# Patient Record
Sex: Male | Born: 1985 | Race: Black or African American | Hispanic: No | Marital: Single | Smoking: Current every day smoker
Health system: Southern US, Community
[De-identification: ages and names within clinical notes are randomized; demographics above are authoritative.]

## PROBLEM LIST (undated history)

## (undated) DIAGNOSIS — K219 Gastro-esophageal reflux disease without esophagitis: Secondary | ICD-10-CM

## (undated) DIAGNOSIS — F32A Depression, unspecified: Secondary | ICD-10-CM

## (undated) HISTORY — DX: Depression, unspecified: F32.A

## (undated) HISTORY — DX: Gastro-esophageal reflux disease without esophagitis: K21.9

---
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
## 2010-11-25 ENCOUNTER — Emergency Department (HOSPITAL_COMMUNITY)
Admission: EM | Admit: 2010-11-25 | Discharge: 2010-11-25 | Payer: Self-pay | Source: Home / Self Care | Admitting: Emergency Medicine

## 2011-03-01 LAB — URINALYSIS, ROUTINE W REFLEX MICROSCOPIC
Bilirubin Urine: NEGATIVE
Glucose, UA: NEGATIVE mg/dL
Hgb urine dipstick: NEGATIVE
Ketones, ur: NEGATIVE mg/dL
Nitrite: NEGATIVE
Protein, ur: NEGATIVE mg/dL
Specific Gravity, Urine: 1.011 (ref 1.005–1.030)
Urobilinogen, UA: 1 mg/dL (ref 0.0–1.0)
pH: 7.5 (ref 5.0–8.0)

## 2011-03-01 LAB — GC/CHLAMYDIA PROBE AMP, GENITAL
Chlamydia, DNA Probe: NEGATIVE
GC Probe Amp, Genital: NEGATIVE

## 2011-07-26 ENCOUNTER — Emergency Department (HOSPITAL_COMMUNITY): Payer: Self-pay

## 2011-07-26 ENCOUNTER — Emergency Department (HOSPITAL_COMMUNITY)
Admission: EM | Admit: 2011-07-26 | Discharge: 2011-07-26 | Disposition: A | Discharge status: Home / Self Care | Payer: Self-pay | Attending: Emergency Medicine | Admitting: Emergency Medicine

## 2011-07-26 DIAGNOSIS — Y990 Civilian activity done for income or pay: Secondary | ICD-10-CM | POA: Insufficient documentation

## 2011-07-26 DIAGNOSIS — S81009A Unspecified open wound, unspecified knee, initial encounter: Secondary | ICD-10-CM | POA: Insufficient documentation

## 2011-07-26 DIAGNOSIS — Y9269 Other specified industrial and construction area as the place of occurrence of the external cause: Secondary | ICD-10-CM | POA: Insufficient documentation

## 2011-07-26 DIAGNOSIS — W268XXA Contact with other sharp object(s), not elsewhere classified, initial encounter: Secondary | ICD-10-CM | POA: Insufficient documentation

## 2016-08-23 ENCOUNTER — Emergency Department (HOSPITAL_COMMUNITY): Payer: Self-pay

## 2016-08-23 ENCOUNTER — Emergency Department (HOSPITAL_COMMUNITY)
Admission: EM | Admit: 2016-08-23 | Discharge: 2016-08-23 | Disposition: A | Discharge status: Home / Self Care | Payer: Self-pay | Attending: Emergency Medicine | Admitting: Emergency Medicine

## 2016-08-23 ENCOUNTER — Encounter (HOSPITAL_COMMUNITY): Payer: Self-pay | Admitting: Emergency Medicine

## 2016-08-23 DIAGNOSIS — K921 Melena: Secondary | ICD-10-CM | POA: Insufficient documentation

## 2016-08-23 DIAGNOSIS — R109 Unspecified abdominal pain: Secondary | ICD-10-CM

## 2016-08-23 DIAGNOSIS — Y999 Unspecified external cause status: Secondary | ICD-10-CM | POA: Insufficient documentation

## 2016-08-23 DIAGNOSIS — Y929 Unspecified place or not applicable: Secondary | ICD-10-CM | POA: Insufficient documentation

## 2016-08-23 DIAGNOSIS — Y939 Activity, unspecified: Secondary | ICD-10-CM | POA: Insufficient documentation

## 2016-08-23 DIAGNOSIS — F17200 Nicotine dependence, unspecified, uncomplicated: Secondary | ICD-10-CM | POA: Insufficient documentation

## 2016-08-23 LAB — COMPREHENSIVE METABOLIC PANEL
ALK PHOS: 53 U/L (ref 38–126)
ALT: 19 U/L (ref 17–63)
AST: 27 U/L (ref 15–41)
Albumin: 3.9 g/dL (ref 3.5–5.0)
Anion gap: 3 — ABNORMAL LOW (ref 5–15)
BUN: 9 mg/dL (ref 6–20)
CALCIUM: 8.9 mg/dL (ref 8.9–10.3)
CO2: 26 mmol/L (ref 22–32)
CREATININE: 1.01 mg/dL (ref 0.61–1.24)
Chloride: 106 mmol/L (ref 101–111)
GFR calc non Af Amer: >60 mL/min (ref >60)
Glucose, Bld: 113 mg/dL — ABNORMAL HIGH (ref 65–99)
Potassium: 4.1 mmol/L (ref 3.5–5.1)
SODIUM: 135 mmol/L (ref 135–145)
Total Bilirubin: 0.7 mg/dL (ref 0.3–1.2)
Total Protein: 6.9 g/dL (ref 6.5–8.1)

## 2016-08-23 LAB — URINE MICROSCOPIC-ADD ON
Squamous Epithelial / LPF: NONE SEEN
WBC, UA: NONE SEEN WBC/hpf (ref 0–5)

## 2016-08-23 LAB — CBC
HCT: 42.7 % (ref 39.0–52.0)
Hemoglobin: 14.3 g/dL (ref 13.0–17.0)
MCH: 31.8 pg (ref 26.0–34.0)
MCHC: 33.5 g/dL (ref 30.0–36.0)
MCV: 95.1 fL (ref 78.0–100.0)
PLATELETS: 189 10*3/uL (ref 150–400)
RBC: 4.49 MIL/uL (ref 4.22–5.81)
RDW: 12.5 % (ref 11.5–15.5)
WBC: 11.3 10*3/uL — ABNORMAL HIGH (ref 4.0–10.5)

## 2016-08-23 LAB — URINALYSIS, ROUTINE W REFLEX MICROSCOPIC
BILIRUBIN URINE: NEGATIVE
GLUCOSE, UA: NEGATIVE mg/dL
KETONES UR: NEGATIVE mg/dL
Leukocytes, UA: NEGATIVE
Nitrite: NEGATIVE
PROTEIN: NEGATIVE mg/dL
Specific Gravity, Urine: 1.026 (ref 1.005–1.030)
pH: 6 (ref 5.0–8.0)

## 2016-08-23 LAB — LIPASE, BLOOD: Lipase: 23 U/L (ref 11–51)

## 2016-08-23 LAB — POC OCCULT BLOOD, ED: FECAL OCCULT BLD: POSITIVE — AB

## 2016-08-23 MED ORDER — IOPAMIDOL (ISOVUE-300) INJECTION 61%
INTRAVENOUS | Status: AC
  Administered 2016-08-23: 100 mL
  Filled 2016-08-23: qty 100

## 2016-08-23 NOTE — ED Provider Notes (Signed)
[facility]-EMERGENCY DEPT Provider Note   CSN: [id number] Arrival date & time: 08/23/16  1021     History   Chief Complaint Chief Complaint  Patient presents with  . Abdominal Pain    HPI BURTIS IMHOFF is a 30 y.o. male.  Patient s/p fall from horse 3 days ago.  Pt states horse reared up, then turned to side, and lost balance, causing patient to fall off, and that part of horse hit his left side. C/o left mid to upper abd pain, constant, dull, moderate, without specific exacerbating or alleviating factors. Today saw small amt blood/blood streaks in stool. No melena. Normal appetite. No vomiting. No hematuria. No chest pain or sob. Denies head injury or loc. No headache. No neck or back pain. Denies any other pain or injury.    The history is provided by the patient.  Abdominal Pain   Pertinent negatives include fever, vomiting and headaches.    History reviewed. No pertinent past medical history.  There are no active problems to display for this patient.   No past surgical history on file.     Home Medications    Prior to Admission medications   Not on File    Family History No family history on file.  Social History Social History  Substance Use Topics  . Smoking status: Current Every Day Smoker  . Smokeless tobacco: Never Used  . Alcohol use Not on file     Allergies   Review of patient's allergies indicates no known allergies.   Review of Systems Review of Systems  Constitutional: Negative for fever.  HENT: Negative for sore throat.   Eyes: Negative for redness.  Respiratory: Negative for shortness of breath.   Cardiovascular: Negative for chest pain.  Gastrointestinal: Positive for abdominal pain. Negative for vomiting.  Genitourinary: Negative for flank pain.  Musculoskeletal: Negative for back pain and neck pain.  Skin: Negative for rash.  Neurological: Negative for headaches.  Hematological: Does not bruise/bleed easily.    Psychiatric/Behavioral: Negative for confusion.     Physical Exam Updated Vital Signs BP 116/70 (BP Location: Right Arm)   Pulse 74   Temp 98.7 F (37.1 C) (Oral)   Resp 20   Ht 6\' 2"  (1.88 m)   Wt 83.9 kg   SpO2 99%   BMI 23.75 kg/m   Physical Exam  Constitutional: He is oriented to person, place, and time. He appears well-developed and well-nourished. No distress.  HENT:  Head: Atraumatic.  Eyes: Pupils are equal, round, and reactive to light.  Neck: Neck supple. No tracheal deviation present.  Cardiovascular: Normal rate, regular rhythm, normal heart sounds and intact distal pulses.   No murmur heard. Pulmonary/Chest: Effort normal and breath sounds normal. No accessory muscle usage. No respiratory distress. He exhibits no tenderness.  Abdominal: Soft. Bowel sounds are normal. He exhibits no distension and no mass. There is tenderness. There is no rebound and no guarding. No hernia.  Left upper abd tenderness.   Genitourinary:  Genitourinary Comments: No cva tenderness. Brown stool, ?streak brb (chaperoned exam w nurse).   Musculoskeletal: Normal range of motion. He exhibits no edema or tenderness.  CTLS spine, non tender, aligned, no step off. No focal bony tenderness on extremity exam.   Neurological: He is alert and oriented to person, place, and time.  Steady gait.   Skin: Skin is warm and dry.  Psychiatric: He has a normal mood and affect.  Nursing note and vitals reviewed.    ED Treatments /  Results  Labs (all labs ordered are listed, but only abnormal results are displayed) Results for orders placed or performed during the hospital encounter of 08/23/16  Lipase, blood  Result Value Ref Range   Lipase 23 11 - 51 U/L  Comprehensive metabolic panel  Result Value Ref Range   Sodium 135 135 - 145 mmol/L   Potassium 4.1 3.5 - 5.1 mmol/L   Chloride 106 101 - 111 mmol/L   CO2 26 22 - 32 mmol/L   Glucose, Bld 113 (H) 65 - 99 mg/dL   BUN 9 6 - 20 mg/dL    Creatinine, Ser 4.09 0.61 - 1.24 mg/dL   Calcium 8.9 8.9 - 81.1 mg/dL   Total Protein 6.9 6.5 - 8.1 g/dL   Albumin 3.9 3.5 - 5.0 g/dL   AST 27 15 - 41 U/L   ALT 19 17 - 63 U/L   Alkaline Phosphatase 53 38 - 126 U/L   Total Bilirubin 0.7 0.3 - 1.2 mg/dL   GFR calc non Af Amer >60 >60 mL/min   GFR calc Af Amer >60 >60 mL/min   Anion gap 3 (L) 5 - 15  CBC  Result Value Ref Range   WBC 11.3 (H) 4.0 - 10.5 K/uL   RBC 4.49 4.22 - 5.81 MIL/uL   Hemoglobin 14.3 13.0 - 17.0 g/dL   HCT 91.4 78.2 - 95.6 %   MCV 95.1 78.0 - 100.0 fL   MCH 31.8 26.0 - 34.0 pg   MCHC 33.5 30.0 - 36.0 g/dL   RDW 21.3 08.6 - 57.8 %   Platelets 189 150 - 400 K/uL  Urinalysis, Routine w reflex microscopic  Result Value Ref Range   Color, Urine YELLOW YELLOW   APPearance CLEAR CLEAR   Specific Gravity, Urine 1.026 1.005 - 1.030   pH 6.0 5.0 - 8.0   Glucose, UA NEGATIVE NEGATIVE mg/dL   Hgb urine dipstick TRACE (A) NEGATIVE   Bilirubin Urine NEGATIVE NEGATIVE   Ketones, ur NEGATIVE NEGATIVE mg/dL   Protein, ur NEGATIVE NEGATIVE mg/dL   Nitrite NEGATIVE NEGATIVE   Leukocytes, UA NEGATIVE NEGATIVE  Urine microscopic-add on  Result Value Ref Range   Squamous Epithelial / LPF NONE SEEN NONE SEEN   WBC, UA NONE SEEN 0 - 5 WBC/hpf   RBC / HPF 0-5 0 - 5 RBC/hpf   Bacteria, UA RARE (A) NONE SEEN   Urine-Other MUCOUS PRESENT   POC occult blood, ED  Result Value Ref Range   Fecal Occult Bld POSITIVE (A) NEGATIVE   Ct Abdomen Pelvis W Contrast  Result Date: 08/23/2016 CLINICAL DATA:  Horse fell on patient 4 days ago. Left flank pain with bloody stool starting yesterday. EXAM: CT ABDOMEN AND PELVIS WITH CONTRAST TECHNIQUE: Multidetector CT imaging of the abdomen and pelvis was performed using the standard protocol following bolus administration of intravenous contrast. CONTRAST:  ISOVUE-300 IOPAMIDOL (ISOVUE-300) INJECTION 61% COMPARISON:  None. FINDINGS: Lower chest:  Unremarkable. Hepatobiliary: 6 mm  well-defined hypo attenuating lesion inferior right liver is too small to characterize but likely represents a tiny cyst. Small area of low attenuation in the anterior liver, adjacent to the falciform ligament, is in a characteristic location for focal fatty deposition. No enhancing abnormality identified within the liver parenchyma. There is no evidence for gallstones, gallbladder wall thickening, or pericholecystic fluid. No intrahepatic or extrahepatic biliary dilation. Pancreas: No focal mass lesion. No dilatation of the main duct. No intraparenchymal cyst. No peripancreatic edema. Spleen: No splenomegaly. No focal mass  lesion. Adrenals/Urinary Tract: No adrenal nodule or mass. 8 mm hypo attenuating lesion upper pole left kidney is associated with 7 mm hypo attenuating well-defined lesion interpolar right kidney. Both lesions too small to characterize but likely represent tiny renal cysts. No evidence for hydroureter. The urinary bladder appears normal for the degree of distention. Stomach/Bowel: Stomach is decompressed. Slight prominence of duodenum. Small bowel loops are largely collapse throughout. The terminal ileum is normal. The appendix is not clearly visualized, but is likely in the anterior right lower quadrant. Although not a definite finding, there may be some mild circumferential wall thickening in the right colon. Vascular/Lymphatic: No abdominal aortic aneurysm. No abdominal aortic atherosclerotic calcification. Portal vein and superior mesenteric vein are patent. Splenic vein is patent. There is no gastrohepatic or hepatoduodenal ligament lymphadenopathy. No intraperitoneal or retroperitoneal lymphadenopathy. No pelvic sidewall lymphadenopathy. Reproductive: The prostate gland and seminal vesicles have normal imaging features. Other: Small volume intraperitoneal free fluid identified in the pelvis. Musculoskeletal: Bone windows reveal no worrisome lytic or sclerotic osseous lesions. Bone island  visualized in the right femoral head. IMPRESSION: 1. Small volume intraperitoneal free fluid, abnormal in a male patient although etiology not evident by CT today. 2. Question mild circumferential wall thickening right colon although not a definite finding. Small bowel and colon are largely decompressed throughout. 3. No evidence for acute traumatic injury in the liver or spleen. Electronically Signed   By: Kennith Center M.D.   On: 08/23/2016 12:57    EKG  EKG Interpretation None       Radiology Ct Abdomen Pelvis W Contrast  Result Date: 08/23/2016 CLINICAL DATA:  Horse fell on patient 4 days ago. Left flank pain with bloody stool starting yesterday. EXAM: CT ABDOMEN AND PELVIS WITH CONTRAST TECHNIQUE: Multidetector CT imaging of the abdomen and pelvis was performed using the standard protocol following bolus administration of intravenous contrast. CONTRAST:  ISOVUE-300 IOPAMIDOL (ISOVUE-300) INJECTION 61% COMPARISON:  None. FINDINGS: Lower chest:  Unremarkable. Hepatobiliary: 6 mm well-defined hypo attenuating lesion inferior right liver is too small to characterize but likely represents a tiny cyst. Small area of low attenuation in the anterior liver, adjacent to the falciform ligament, is in a characteristic location for focal fatty deposition. No enhancing abnormality identified within the liver parenchyma. There is no evidence for gallstones, gallbladder wall thickening, or pericholecystic fluid. No intrahepatic or extrahepatic biliary dilation. Pancreas: No focal mass lesion. No dilatation of the main duct. No intraparenchymal cyst. No peripancreatic edema. Spleen: No splenomegaly. No focal mass lesion. Adrenals/Urinary Tract: No adrenal nodule or mass. 8 mm hypo attenuating lesion upper pole left kidney is associated with 7 mm hypo attenuating well-defined lesion interpolar right kidney. Both lesions too small to characterize but likely represent tiny renal cysts. No evidence for  hydroureter. The urinary bladder appears normal for the degree of distention. Stomach/Bowel: Stomach is decompressed. Slight prominence of duodenum. Small bowel loops are largely collapse throughout. The terminal ileum is normal. The appendix is not clearly visualized, but is likely in the anterior right lower quadrant. Although not a definite finding, there may be some mild circumferential wall thickening in the right colon. Vascular/Lymphatic: No abdominal aortic aneurysm. No abdominal aortic atherosclerotic calcification. Portal vein and superior mesenteric vein are patent. Splenic vein is patent. There is no gastrohepatic or hepatoduodenal ligament lymphadenopathy. No intraperitoneal or retroperitoneal lymphadenopathy. No pelvic sidewall lymphadenopathy. Reproductive: The prostate gland and seminal vesicles have normal imaging features. Other: Small volume intraperitoneal free fluid identified in the pelvis.  Musculoskeletal: Bone windows reveal no worrisome lytic or sclerotic osseous lesions. Bone island visualized in the right femoral head. IMPRESSION: 1. Small volume intraperitoneal free fluid, abnormal in a male patient although etiology not evident by CT today. 2. Question mild circumferential wall thickening right colon although not a definite finding. Small bowel and colon are largely decompressed throughout. 3. No evidence for acute traumatic injury in the liver or spleen. Electronically Signed   By: Eric  MansellKennith Center M.D.   On: 08/23/2016 12:57    Procedures Procedures (including critical care time)  Medications Ordered in ED Medications - No data to display   Initial Impression / Assessment and Plan / ED Course  I have reviewed the triage vital signs and the nursing notes.  Pertinent labs & imaging results that were available during my care of the patient were reviewed by me and considered in my medical decision making (see chart for details).  Clinical Course    Iv ns. Labs. Ct.  On CT,  no acute, traumatic intraabdominal injury seen.  Incidental findings noted, and will relayed to pt for f/u.   Patients stools is medium brown, w small streak brb, and does test hemoccult pos - will have f/u as outpatient.   Pt afeb, no emesis, repeat abd exam reassuring.    Patient currently appears stable for d/c.     Final Clinical Impressions(s) / ED Diagnoses   Final diagnoses:  None    New Prescriptions New Prescriptions   No medications on file     Cathren LaineKevin Lauraann Missey, MD 08/23/16 1334

## 2016-08-23 NOTE — ED Triage Notes (Addendum)
Horse fell on him  Left side  And threw him on sat at 6 pm ab pain since Sunday am but states had a lot of etoh on sat also felt constipated now has blood in stool

## 2016-08-23 NOTE — Discharge Instructions (Signed)
It was our pleasure to provide your ER care today - we hope that you feel better.  Your CT scan was read as follows: IMPRESSION: 1. Small volume intraperitoneal free fluid, abnormal in a male patient although etiology not evident by CT today. 2. Question mild circumferential wall thickening right colon although not a definite finding. Small bowel and colon are largely decompressed throughout. 3. No evidence for acute traumatic injury in the liver or spleen. Hepatobiliary: 6 mm well-defined hypo attenuating lesion inferior right liver is too small to characterize but likely represents a tiny cyst.  For incidental CT findings above, follow up with primary care doctor in the next couple weeks - discuss results with them.   For recent blood in stool, as well as for follow up of above CT scan, follow up with GI specialist in the next couple weeks - see  referral - call office to arrange appointment.  Return to ER if worse, new symptoms, high fevers, persistent vomiting, worsening/severe abdominal pain, heavy bleeding, other concern.

## 2016-08-27 ENCOUNTER — Emergency Department (HOSPITAL_COMMUNITY): Payer: Self-pay

## 2016-08-27 ENCOUNTER — Observation Stay (HOSPITAL_COMMUNITY)
Admission: EM | Admit: 2016-08-27 | Discharge: 2016-08-28 | Disposition: A | Discharge status: Home / Self Care | Payer: Self-pay | Attending: Surgery | Admitting: Surgery

## 2016-08-27 ENCOUNTER — Encounter (HOSPITAL_COMMUNITY): Payer: Self-pay

## 2016-08-27 DIAGNOSIS — R109 Unspecified abdominal pain: Secondary | ICD-10-CM

## 2016-08-27 DIAGNOSIS — S3991XA Unspecified injury of abdomen, initial encounter: Principal | ICD-10-CM | POA: Diagnosis present

## 2016-08-27 DIAGNOSIS — F17210 Nicotine dependence, cigarettes, uncomplicated: Secondary | ICD-10-CM | POA: Insufficient documentation

## 2016-08-27 DIAGNOSIS — T1490XA Injury, unspecified, initial encounter: Secondary | ICD-10-CM

## 2016-08-27 DIAGNOSIS — K921 Melena: Secondary | ICD-10-CM | POA: Insufficient documentation

## 2016-08-27 LAB — URINALYSIS, ROUTINE W REFLEX MICROSCOPIC
BILIRUBIN URINE: NEGATIVE
GLUCOSE, UA: NEGATIVE mg/dL
Hgb urine dipstick: NEGATIVE
KETONES UR: NEGATIVE mg/dL
LEUKOCYTES UA: NEGATIVE
NITRITE: NEGATIVE
PH: 6 (ref 5.0–8.0)
PROTEIN: NEGATIVE mg/dL
Specific Gravity, Urine: 1.013 (ref 1.005–1.030)

## 2016-08-27 LAB — COMPREHENSIVE METABOLIC PANEL
ALBUMIN: 4 g/dL (ref 3.5–5.0)
ALK PHOS: 53 U/L (ref 38–126)
ALT: 16 U/L — AB (ref 17–63)
AST: 17 U/L (ref 15–41)
Anion gap: 8 (ref 5–15)
BILIRUBIN TOTAL: 0.8 mg/dL (ref 0.3–1.2)
BUN: 10 mg/dL (ref 6–20)
CO2: 25 mmol/L (ref 22–32)
CREATININE: 1.03 mg/dL (ref 0.61–1.24)
Calcium: 9 mg/dL (ref 8.9–10.3)
Chloride: 102 mmol/L (ref 101–111)
GFR calc Af Amer: >60 mL/min (ref >60)
GLUCOSE: 113 mg/dL — AB (ref 65–99)
POTASSIUM: 4 mmol/L (ref 3.5–5.1)
Sodium: 135 mmol/L (ref 135–145)
TOTAL PROTEIN: 7.7 g/dL (ref 6.5–8.1)

## 2016-08-27 LAB — CBC
HEMATOCRIT: 41.3 % (ref 39.0–52.0)
Hemoglobin: 14.4 g/dL (ref 13.0–17.0)
MCH: 32.1 pg (ref 26.0–34.0)
MCHC: 34.9 g/dL (ref 30.0–36.0)
MCV: 92.2 fL (ref 78.0–100.0)
PLATELETS: 202 10*3/uL (ref 150–400)
RBC: 4.48 MIL/uL (ref 4.22–5.81)
RDW: 12.3 % (ref 11.5–15.5)
WBC: 11.8 10*3/uL — AB (ref 4.0–10.5)

## 2016-08-27 LAB — LIPASE, BLOOD: Lipase: 21 U/L (ref 11–51)

## 2016-08-27 MED ORDER — IOPAMIDOL (ISOVUE-300) INJECTION 61%
100.0000 mL | Freq: Once | INTRAVENOUS | Status: AC | PRN
  Administered 2016-08-27: 100 mL via INTRAVENOUS

## 2016-08-27 MED ORDER — ONDANSETRON HCL 4 MG PO TABS: 4.0000 mg | ORAL_TABLET | Freq: Four times a day (QID) | ORAL | Status: DC | PRN

## 2016-08-27 MED ORDER — ACETAMINOPHEN 325 MG PO TABS
650.0000 mg | ORAL_TABLET | Freq: Once | ORAL | Status: AC
  Administered 2016-08-27: 650 mg via ORAL
  Filled 2016-08-27: qty 2

## 2016-08-27 MED ORDER — MORPHINE SULFATE (PF) 2 MG/ML IV SOLN
1.0000 mg | INTRAVENOUS | Status: DC | PRN
Start: 2016-08-27 — End: 2016-08-28
  Administered 2016-08-27: 1 mg via INTRAVENOUS
  Filled 2016-08-27: qty 1

## 2016-08-27 MED ORDER — ONDANSETRON HCL 4 MG/2ML IJ SOLN: 4.0000 mg | Freq: Four times a day (QID) | INTRAMUSCULAR | Status: DC | PRN

## 2016-08-27 MED ORDER — FAMOTIDINE IN NACL 20-0.9 MG/50ML-% IV SOLN
20.0000 mg | Freq: Once | INTRAVENOUS | Status: AC
  Administered 2016-08-27: 20 mg via INTRAVENOUS
  Filled 2016-08-27: qty 50

## 2016-08-27 MED ORDER — GI COCKTAIL ~~LOC~~
30.0000 mL | Freq: Once | ORAL | Status: AC
  Administered 2016-08-27: 30 mL via ORAL
  Filled 2016-08-27: qty 30

## 2016-08-27 MED ORDER — MORPHINE SULFATE (PF) 2 MG/ML IV SOLN: 1.0000 mg | INTRAVENOUS | Status: DC | PRN

## 2016-08-27 MED ORDER — HEPARIN SODIUM (PORCINE) 5000 UNIT/ML IJ SOLN
5000.0000 [IU] | Freq: Three times a day (TID) | INTRAMUSCULAR | Status: DC
  Administered 2016-08-27: 5000 [IU] via SUBCUTANEOUS
  Filled 2016-08-27: qty 1

## 2016-08-27 MED ORDER — KCL IN DEXTROSE-NACL 20-5-0.45 MEQ/L-%-% IV SOLN
INTRAVENOUS | Status: DC
  Administered 2016-08-27 – 2016-08-28 (×2): via INTRAVENOUS
  Filled 2016-08-27 (×2): qty 1000

## 2016-08-27 MED ORDER — HEPARIN SODIUM (PORCINE) 5000 UNIT/ML IJ SOLN: 5000.0000 [IU] | Freq: Three times a day (TID) | INTRAMUSCULAR | Status: DC

## 2016-08-27 MED ORDER — SODIUM CHLORIDE 0.9 % IV BOLUS (SEPSIS)
1000.0000 mL | Freq: Once | INTRAVENOUS | Status: AC
  Administered 2016-08-27: 1000 mL via INTRAVENOUS

## 2016-08-27 MED ORDER — KCL IN DEXTROSE-NACL 20-5-0.45 MEQ/L-%-% IV SOLN: INTRAVENOUS | Status: DC

## 2016-08-27 NOTE — ED Notes (Signed)
Pt has requested to transfer via private vehicle, Dr. Daphine DeutscherMartin has approved his request.

## 2016-08-27 NOTE — ED Provider Notes (Signed)
[facility]-EMERGENCY DEPT Provider Note   CSN: [id number] Arrival date & time: 08/27/16  1251     History   Chief Complaint Chief Complaint  Patient presents with  . Abdominal Pain    HPI Blake Harding is a 30 y.o. male.  HPI Blake Harding is a 30 y.o. male with no significant PMH who presents with continued abdominal pain.  Patient was seen 4 days ago for the same.  He fell off a horse 7 days ago and then began experiencing left sided abdominal pain.  He was seen in ED 4 days ago and had a normal abdominal CT, he had fecal occult positive and given outpatient GI follow up.  He presents today with continued upper and left sided abdominal pain he describes as burning.  Worse with eating.  Associated symptoms include intermittent N/V and diarrhea.  Had 6 episodes this morning he describes as "medium brown with some blood".  He states he feels more gassy than normal.  No fever or urinary symptoms.  He has not taken anything for pain.  Has been able to tolerate ginger ale and crackers.  No abdominal surgeries.  Smokes 0.5 ppd. Drinks "socially".   History reviewed. No pertinent past medical history.  Patient Active Problem List   Diagnosis Date Noted  . Blunt abdominal trauma 08/27/2016    History reviewed. No pertinent surgical history.     Home Medications    Prior to Admission medications   Not on File    Family History History reviewed. No pertinent family history.  Social History Social History  Substance Use Topics  . Smoking status: Current Every Day Smoker  . Smokeless tobacco: Never Used  . Alcohol use Yes     Comment: social     Allergies   Review of patient's allergies indicates no known allergies.   Review of Systems Review of Systems All other systems negative unless otherwise stated in HPI   Physical Exam Updated Vital Signs BP 100/65 (BP Location: Left Arm)   Pulse (!) 56   Temp 98.3 F (36.8 C)   Resp 14   Ht 6\' 2"  (1.88 m)   Wt 81.6  kg   SpO2 100%   BMI 23.11 kg/m   Physical Exam  Constitutional: He is oriented to person, place, and time. He appears well-developed and well-nourished.  Non-toxic appearance. He does not have a sickly appearance. He does not appear ill.  HENT:  Head: Normocephalic and atraumatic.  Mouth/Throat: Oropharynx is clear and moist.  Eyes: Conjunctivae are normal. Pupils are equal, round, and reactive to light.  Neck: Normal range of motion. Neck supple.  Cardiovascular: Normal rate, regular rhythm and normal heart sounds.   Pulmonary/Chest: Effort normal and breath sounds normal. No accessory muscle usage or stridor. No respiratory distress. He has no wheezes. He has no rhonchi. He has no rales.  Abdominal: Soft. Bowel sounds are normal. He exhibits no distension. There is tenderness in the epigastric area, left upper quadrant and left lower quadrant. There is no rebound and no guarding.  Musculoskeletal: Normal range of motion.  No t/l/s midline tenderness.   Lymphadenopathy:    He has no cervical adenopathy.  Neurological: He is alert and oriented to person, place, and time.  Speech clear without dysarthria.  Skin: Skin is warm and dry.  No bruising or signs of trauma.   Psychiatric: He has a normal mood and affect. His behavior is normal.     ED Treatments / Results  Labs (all labs ordered are listed, but only abnormal results are displayed) Labs Reviewed  COMPREHENSIVE METABOLIC PANEL - Abnormal; Notable for the following:       Result Value   Glucose, Bld 113 (*)    ALT 16 (*)    All other components within normal limits  CBC - Abnormal; Notable for the following:    WBC 11.8 (*)    All other components within normal limits  LIPASE, BLOOD  URINALYSIS, ROUTINE W REFLEX MICROSCOPIC (NOT [facility])    EKG  EKG Interpretation None       Radiology Ct Abdomen Pelvis W Contrast  Result Date: 08/27/2016 CLINICAL DATA:  Status post trauma. A horse fell on the patient last  Saturday with left-sided abdominal pain. Bloody stool. A CT scan from August 23, 2016 demonstrated fluid in the pelvis with no underlying cause identified. EXAM: CT ABDOMEN AND PELVIS WITH CONTRAST TECHNIQUE: Multidetector CT imaging of the abdomen and pelvis was performed using the standard protocol following bolus administration of intravenous contrast. CONTRAST:  100mL ISOVUE-300 IOPAMIDOL (ISOVUE-300) INJECTION 61% COMPARISON:  August 23, 2016 FINDINGS: Lower chest: Normal. Hepatobiliary: A tiny low-attenuation lesion in the left hepatic lobe on image 16 is too small characterize but probably a cyst. A 6 mm lesion in the right hepatic lobe on image 36 is unchanged and almost certainly a cyst as well but too small to characterize. The liver, portal vein, and gallbladder are normal. Pancreas: Unremarkable. No pancreatic ductal dilatation or surrounding inflammatory changes. Spleen: Normal in size without focal abnormality. Adrenals/Urinary Tract: Probable tiny renal cysts are identified but too small to characterize. Stomach/Bowel: The stomach and small bowel are unremarkable. The appendix is not well visualized but there is no evidence of appendicitis. The colon is not well assessed due to lack of distention with oral contrast but no colonic abnormalities are seen. Vascular/Lymphatic: No significant vascular findings are present. No enlarged abdominal or pelvic lymph nodes. Reproductive: Prostate is unremarkable. Other: No free air is identified on today's study. The free fluid in the pelvis on the previous study has significantly improved with only minimal fluid remaining. Musculoskeletal: No fractures. IMPRESSION: 1. The amount of free fluid in the pelvis has significantly decreased in the interval with only a small amount of fluid remaining. 2. No cause for bloody stool is seen. Evaluation of colon is limited with CT imaging in general but especially due to the lack of distention with oral contrast. 3. No  other acute abnormalities. Electronically Signed   By: Gerome Samavid  Williams III M.D   On: 08/27/2016 17:02    Procedures Procedures (including critical care time)  Medications Ordered in ED Medications  sodium chloride 0.9 % bolus 1,000 mL (0 mLs Intravenous Stopped 08/27/16 1600)  famotidine (PEPCID) IVPB 20 mg premix (0 mg Intravenous Stopped 08/27/16 1600)  acetaminophen (TYLENOL) tablet 650 mg (650 mg Oral Given 08/27/16 1342)  gi cocktail (Maalox,Lidocaine,Donnatal) (30 mLs Oral Given 08/27/16 1342)  iopamidol (ISOVUE-300) 61 % injection 100 mL (100 mLs Intravenous Contrast Given 08/27/16 1638)     Initial Impression / Assessment and Plan / ED Course  I have reviewed the triage vital signs and the nursing notes.  Pertinent labs & imaging results that were available during my care of the patient were reviewed by me and considered in my medical decision making (see chart for details).  Clinical Course   Patient presents with persistent burning epigastric and left sided abdominal pain.  Seen 4 days ago with normal  CT.  Fecal occult positive, given GI outpatient follow up.  Patient appears well, non-toxic or septic.  VSS, NAD.  Mild epigastric, LUQ, and LLQ abdominal tenderness.  No rebound, guarding, or rigidity.  No signs of trauma.  Will repeat labs, IVF, gi cocktail, pepcid, and tylenol and reassess.  2:30 PM: Stable hgb. Patient reports some improvement.  Case discussed with Dr. Dalene Seltzer.  Patient with hx of trauma, persistent abdominal pain, tender on exam, and previous CT findings of intraperitoneal free fluid 4 days ago.  Will obtain repeat CT.    This shows improved free fluid in the pelvis with small amount remaining.  Patient seen by Dr. Dalene Seltzer, patient with continued abdominal pain.  General surgery, Dr. Daphine Deutscher consulted and evaluated the patient.  Will admit and transfer to trauma service at [facility].    Final Clinical Impressions(s) / ED  Diagnoses   Final diagnoses:  Abdominal pain, unspecified abdominal location    New Prescriptions New Prescriptions   No medications on file     Cheri Fowler, PA-C 08/27/16 1952    Cheri Fowler, PA-C 08/27/16 1953    Alvira Monday, MD 08/30/16 2150

## 2016-08-27 NOTE — ED Notes (Signed)
Writer made pt aware that urine is needed for a sample, urinal at bedside

## 2016-08-27 NOTE — ED Triage Notes (Signed)
Pt here with abdominal pain on Tuesday.  Pt CT states free fluid. Pt states pain is staying same. Pt also indicates injury on Saturday to abdomen.  Pain started on Monday.  N/V.  Unknown fever.  Bloody stools

## 2016-08-27 NOTE — H&P (Signed)
Chief Complaint:  Horse rolled over him last Saturday evening  History of Present Illness:  Blake Harding is an 31 y.o. male who had a horse rear and fall back on him last Saturday night.  Was on a trail ride and at the time he did not experience pain.  The next morning he had severe abdominal soreness like bruised muscle.  Began having pain with eating and then some vomiting.  Was seen in the ER at [facility] on Tuesday and after CT scan which showed some free abdominal fluid and a WBC of 11 k, he was discharged.  Didn't feel better and on Thursday evening he had chills and fever.  Last night he passed what looks like fresh bloody mucous per rectum.    He presented to the [facility] ER and I was called and am transferring him to [facility] for observation for possible compression serosal injury to the colon.  Will get lipase to rule out duodenal or pancreatic injury.    History reviewed. No pertinent past medical history.  History reviewed. No pertinent surgical history.  No current facility-administered medications for this encounter.    No current outpatient prescriptions on file.   Review of patient's allergies indicates no known allergies. History reviewed. No pertinent family history. Social History:   reports that he has been smoking.  He has never used smokeless tobacco. He reports that he drinks alcohol. He reports that he does not use drugs.   REVIEW OF SYSTEMS : Negative except for healthy  Physical Exam:   Blood pressure 111/59, pulse 62, temperature 98.4 F (36.9 C), temperature source Oral, resp. rate 18, height _0  (1.88 m), weight 81.6 kg (180 lb), SpO2 100 %. Body mass index is 23.11 kg/m.  Gen:  WDWN AAM NAD  Neurological: Alert and oriented to person, place, and time. Motor and sensory function is grossly intact  Head: Normocephalic and atraumatic.  Eyes: Conjunctivae are normal. Pupils are equal, round, and reactive to light. No scleral icterus.  Neck: Normal range of motion.  Neck supple. No tracheal deviation or thyromegaly present.  Cardiovascular:  SR without murmurs or gallops.  No carotid bruits Breast:  Not examined Respiratory: Effort normal.  No respiratory distress. No chest wall tenderness. Breath sounds normal.  No wheezes, rales or rhonchi.  Abdomen:  Muscular and tender especially in the left lower quadrant GU:  Normal male Musculoskeletal: Normal range of motion. Extremities are nontender. No cyanosis, edema or clubbing noted Lymphadenopathy: No cervical, preauricular, postauricular or axillary adenopathy is present Skin: Skin is warm and dry. No rash noted. No diaphoresis. No erythema. No pallor. Pscyh: Normal mood and affect. Behavior is normal. Judgment and thought content normal.   LABORATORY RESULTS: Results for orders placed or performed during the hospital encounter of 08/27/16 (from the past 48 hour(s))  Lipase, blood     Status: None   Collection Time: 08/27/16  1:13 PM  Result Value Ref Range   Lipase 21 11 - 51 U/L  Comprehensive metabolic panel     Status: Abnormal   Collection Time: 08/27/16  1:13 PM  Result Value Ref Range   Sodium 135 135 - 145 mmol/L   Potassium 4.0 3.5 - 5.1 mmol/L   Chloride 102 101 - 111 mmol/L   CO2 25 22 - 32 mmol/L   Glucose, Bld 113 (H) 65 - 99 mg/dL   BUN 10 6 - 20 mg/dL   Creatinine, Ser 1.03 0.61 - 1.24 mg/dL   Calcium 9.0  8.9 - 10.3 mg/dL   Total Protein 7.7 6.5 - 8.1 g/dL   Albumin 4.0 3.5 - 5.0 g/dL   AST 17 15 - 41 U/L   ALT 16 (L) 17 - 63 U/L   Alkaline Phosphatase 53 38 - 126 U/L   Total Bilirubin 0.8 0.3 - 1.2 mg/dL   GFR calc non Af Amer >60 >60 mL/min   GFR calc Af Amer >60 >60 mL/min    Comment: (NOTE) The eGFR has been calculated using the CKD EPI equation. This calculation has not been validated in all clinical situations. eGFR's persistently <60 mL/min signify possible Chronic Kidney Disease.    Anion gap 8 5 - 15  CBC     Status: Abnormal   Collection Time: 08/27/16  1:13 PM   Result Value Ref Range   WBC 11.8 (H) 4.0 - 10.5 K/uL   RBC 4.48 4.22 - 5.81 MIL/uL   Hemoglobin 14.4 13.0 - 17.0 g/dL   HCT 41.3 39.0 - 52.0 %   MCV 92.2 78.0 - 100.0 fL   MCH 32.1 26.0 - 34.0 pg   MCHC 34.9 30.0 - 36.0 g/dL   RDW 12.3 11.5 - 15.5 %   Platelets 202 150 - 400 K/uL  Urinalysis, Routine w reflex microscopic     Status: None   Collection Time: 08/27/16  4:42 PM  Result Value Ref Range   Color, Urine YELLOW YELLOW   APPearance CLEAR CLEAR   Specific Gravity, Urine 1.013 1.005 - 1.030   pH 6.0 5.0 - 8.0   Glucose, UA NEGATIVE NEGATIVE mg/dL   Hgb urine dipstick NEGATIVE NEGATIVE   Bilirubin Urine NEGATIVE NEGATIVE   Ketones, ur NEGATIVE NEGATIVE mg/dL   Protein, ur NEGATIVE NEGATIVE mg/dL   Nitrite NEGATIVE NEGATIVE   Leukocytes, UA NEGATIVE NEGATIVE    Comment: MICROSCOPIC NOT DONE ON URINES WITH NEGATIVE PROTEIN, BLOOD, LEUKOCYTES, NITRITE, OR GLUCOSE <1000 mg/dL.     RADIOLOGY RESULTS: Ct Abdomen Pelvis W Contrast  Result Date: 08/27/2016 CLINICAL DATA:  Status post trauma. A horse fell on the patient last Saturday with left-sided abdominal pain. Bloody stool. A CT scan from August 23, 2016 demonstrated fluid in the pelvis with no underlying cause identified. EXAM: CT ABDOMEN AND PELVIS WITH CONTRAST TECHNIQUE: Multidetector CT imaging of the abdomen and pelvis was performed using the standard protocol following bolus administration of intravenous contrast. CONTRAST:  126m ISOVUE-300 IOPAMIDOL (ISOVUE-300) INJECTION 61% COMPARISON:  August 23, 2016 FINDINGS: Lower chest: Normal. Hepatobiliary: A tiny low-attenuation lesion in the left hepatic lobe on image 16 is too small characterize but probably a cyst. A 6 mm lesion in the right hepatic lobe on image 36 is unchanged and almost certainly a cyst as well but too small to characterize. The liver, portal vein, and gallbladder are normal. Pancreas: Unremarkable. No pancreatic ductal dilatation or surrounding  inflammatory changes. Spleen: Normal in size without focal abnormality. Adrenals/Urinary Tract: Probable tiny renal cysts are identified but too small to characterize. Stomach/Bowel: The stomach and small bowel are unremarkable. The appendix is not well visualized but there is no evidence of appendicitis. The colon is not well assessed due to lack of distention with oral contrast but no colonic abnormalities are seen. Vascular/Lymphatic: No significant vascular findings are present. No enlarged abdominal or pelvic lymph nodes. Reproductive: Prostate is unremarkable. Other: No free air is identified on today's study. The free fluid in the pelvis on the previous study has significantly improved with only minimal fluid remaining. Musculoskeletal: No  fractures. IMPRESSION: 1. The amount of free fluid in the pelvis has significantly decreased in the interval with only a small amount of fluid remaining. 2. No cause for bloody stool is seen. Evaluation of colon is limited with CT imaging in general but especially due to the lack of distention with oral contrast. 3. No other acute abnormalities. Electronically Signed   By: Dorise Bullion III M.D   On: 08/27/2016 17:02    Problem List: There are no active problems to display for this patient.   Assessment & Plan: WBC 11K with free abdominal fluid and history of animal rollover trauma.  Transfer to trauma service for observation and possilbe empiric antibiotic tx.      Matt B. Hassell Done, MD, [facility] [phone] beeper [phone]  08/27/2016 7:31 PM

## 2016-08-28 DIAGNOSIS — T1490XA Injury, unspecified, initial encounter: Secondary | ICD-10-CM

## 2016-08-28 LAB — CBC WITH DIFFERENTIAL/PLATELET
BASOS ABS: 0.2 10*3/uL — AB (ref 0.0–0.1)
Basophils Relative: 2 %
EOS ABS: 0.2 10*3/uL (ref 0.0–0.7)
Eosinophils Relative: 2 %
HEMATOCRIT: 38.8 % — AB (ref 39.0–52.0)
HEMOGLOBIN: 12.8 g/dL — AB (ref 13.0–17.0)
LYMPHS ABS: 2.3 10*3/uL (ref 0.7–4.0)
LYMPHS PCT: 30 %
MCH: 31.1 pg (ref 26.0–34.0)
MCHC: 33 g/dL (ref 30.0–36.0)
MCV: 94.2 fL (ref 78.0–100.0)
MONOS PCT: 12 %
Monocytes Absolute: 0.9 10*3/uL (ref 0.1–1.0)
NEUTROS ABS: 4 10*3/uL (ref 1.7–7.7)
Neutrophils Relative %: 54 %
Platelets: 191 10*3/uL (ref 150–400)
RBC: 4.12 MIL/uL — AB (ref 4.22–5.81)
RDW: 12.3 % (ref 11.5–15.5)
WBC: 7.6 10*3/uL (ref 4.0–10.5)

## 2016-08-28 LAB — COMPREHENSIVE METABOLIC PANEL
ALT: 15 U/L — ABNORMAL LOW (ref 17–63)
ANION GAP: 8 (ref 5–15)
AST: 15 U/L (ref 15–41)
Albumin: 3 g/dL — ABNORMAL LOW (ref 3.5–5.0)
Alkaline Phosphatase: 46 U/L (ref 38–126)
BUN: 6 mg/dL (ref 6–20)
CHLORIDE: 101 mmol/L (ref 101–111)
CO2: 26 mmol/L (ref 22–32)
Calcium: 8.6 mg/dL — ABNORMAL LOW (ref 8.9–10.3)
Creatinine, Ser: 1.14 mg/dL (ref 0.61–1.24)
Glucose, Bld: 97 mg/dL (ref 65–99)
Potassium: 3.6 mmol/L (ref 3.5–5.1)
SODIUM: 135 mmol/L (ref 135–145)
Total Bilirubin: 0.5 mg/dL (ref 0.3–1.2)
Total Protein: 6 g/dL — ABNORMAL LOW (ref 6.5–8.1)

## 2016-08-28 LAB — CBC
HCT: 37.6 % — ABNORMAL LOW (ref 39.0–52.0)
HEMOGLOBIN: 12.8 g/dL — AB (ref 13.0–17.0)
MCH: 31.7 pg (ref 26.0–34.0)
MCHC: 34 g/dL (ref 30.0–36.0)
MCV: 93.1 fL (ref 78.0–100.0)
PLATELETS: 176 10*3/uL (ref 150–400)
RBC: 4.04 MIL/uL — AB (ref 4.22–5.81)
RDW: 12.2 % (ref 11.5–15.5)
WBC: 10.3 10*3/uL (ref 4.0–10.5)

## 2016-08-28 LAB — CREATININE, SERUM: CREATININE: 1.1 mg/dL (ref 0.61–1.24)

## 2016-08-28 LAB — AMYLASE
AMYLASE: 46 U/L (ref 28–100)
Amylase: 45 U/L (ref 28–100)

## 2016-08-28 LAB — LIPASE, BLOOD: LIPASE: 19 U/L (ref 11–51)

## 2016-08-28 MED ORDER — WHITE PETROLATUM GEL
Status: AC
  Administered 2016-08-28: 09:00:00
  Filled 2016-08-28: qty 1

## 2016-08-28 NOTE — Progress Notes (Signed)
Nurse paged me stating that pt did well with diet today. No n/v/abd pain. Just a small blood streak on TP. Vitals stable.   Per Dr Derrell Lollingamirez, pt could be discharged if tolerated diet without pain/vital sign issues  Pt given dc instructions  Mary Sellaric M. Andrey CampanileWilson, MD, FACS General, Bariatric, & Minimally Invasive Surgery [facility] [place]

## 2016-08-28 NOTE — Progress Notes (Signed)
Subjective: Doing well No abd pain since admission Some bloody mucus stools  Objective: Vital signs in last 24 hours: Temp:  [98.2 F (36.8 C)-99.2 F (37.3 C)] 98.2 F (36.8 C) (09/10 0538) Pulse Rate:  [56-82] 58 (09/10 0538) Resp:  [14-18] 17 (09/10 0538) BP: (99-112)/(57-82) 105/58 (09/10 0538) SpO2:  [98 %-100 %] 98 % (09/10 0538) Weight:  [81.6 kg (180 lb)-83.5 kg (184 lb)] 83.5 kg (184 lb) (09/09 2249) Last BM Date: 08/27/16  Intake/Output from previous day: 09/09 0701 - 09/10 0700 In: 1945 [P.O.:220; I.V.:675; IV Piggyback:1050] Out: -  Intake/Output this shift: No intake/output data recorded.  General appearance: alert and cooperative Abd: min abdominal pain LLQ/suprapubic area  Lab Results:   Recent Labs  08/27/16 2349 08/28/16 0541  WBC 10.3 7.6  HGB 12.8* 12.8*  HCT 37.6* 38.8*  PLT 176 191   BMET  Recent Labs  08/27/16 1313 08/27/16 2349 08/28/16 0541  NA 135  --  135  K 4.0  --  3.6  CL 102  --  101  CO2 25  --  26  GLUCOSE 113*  --  97  BUN 10  --  6  CREATININE 1.03 1.10 1.14  CALCIUM 9.0  --  8.6*    Studies/Results: Ct Abdomen Pelvis W Contrast  Result Date: 08/27/2016 CLINICAL DATA:  Status post trauma. A horse fell on the patient last Saturday with left-sided abdominal pain. Bloody stool. A CT scan from August 23, 2016 demonstrated fluid in the pelvis with no underlying cause identified. EXAM: CT ABDOMEN AND PELVIS WITH CONTRAST TECHNIQUE: Multidetector CT imaging of the abdomen and pelvis was performed using the standard protocol following bolus administration of intravenous contrast. CONTRAST:  100mL ISOVUE-300 IOPAMIDOL (ISOVUE-300) INJECTION 61% COMPARISON:  August 23, 2016 FINDINGS: Lower chest: Normal. Hepatobiliary: A tiny low-attenuation lesion in the left hepatic lobe on image 16 is too small characterize but probably a cyst. A 6 mm lesion in the right hepatic lobe on image 36 is unchanged and almost certainly a cyst as  well but too small to characterize. The liver, portal vein, and gallbladder are normal. Pancreas: Unremarkable. No pancreatic ductal dilatation or surrounding inflammatory changes. Spleen: Normal in size without focal abnormality. Adrenals/Urinary Tract: Probable tiny renal cysts are identified but too small to characterize. Stomach/Bowel: The stomach and small bowel are unremarkable. The appendix is not well visualized but there is no evidence of appendicitis. The colon is not well assessed due to lack of distention with oral contrast but no colonic abnormalities are seen. Vascular/Lymphatic: No significant vascular findings are present. No enlarged abdominal or pelvic lymph nodes. Reproductive: Prostate is unremarkable. Other: No free air is identified on today's study. The free fluid in the pelvis on the previous study has significantly improved with only minimal fluid remaining. Musculoskeletal: No fractures. IMPRESSION: 1. The amount of free fluid in the pelvis has significantly decreased in the interval with only a small amount of fluid remaining. 2. No cause for bloody stool is seen. Evaluation of colon is limited with CT imaging in general but especially due to the lack of distention with oral contrast. 3. No other acute abnormalities. Electronically Signed   By: Gerome Samavid  Williams III M.D   On: 08/27/2016 17:02    Assessment/Plan: 30 y/o M s/p fall from horse with blunt abd trauma -Pt's pain has improved since admission.  He has not had any PO since the last few days.  Will attempt PO trial and if tolerated OK for  home.  If he has abd pain may require another day or two for serial abd exam and possible CT with V contrast.  Blood in stools improving  LOS: 1 day    Marigene Ehlers., Jed Limerick 08/28/2016

## 2016-08-28 NOTE — Discharge Instructions (Signed)
Blunt Abdominal Trauma °Blunt abdominal trauma is a type of injury that involves damage to the abdominal wall or to abdominal organs, such as the liver or spleen. The damage can involve bruising, tearing, or a rupture. This type of injury does not involve a puncture of the skin. °Blunt abdominal trauma can range from mild to severe. In some cases it can lead to a severe abdominal inflammation (peritonitis), severe bleeding, and a dangerous drop in blood pressure. °CAUSES °This injury is caused by a hard, direct hit to the abdomen. It can happen after: °· A motor vehicle accident. °· Being kicked or punched in the abdomen. °· Falling from a significant height. °RISK FACTORS °This injury is more likely to happen in people who: °· Play contact sports. °· Work in a job in which falls or injuries are more likely, such as in construction. °SYMPTOMS °The main symptom of this condition is pain in the abdomen. Other symptoms depend on the type and location of the injury. They can include: °· Abdominal pain that spreads to the the back or shoulder. °· Bruising. °· Swelling. °· Pain when pressing on the abdomen. °· Blood in the urine. °· Weakness. °· Confusion. °· Loss of consciousness. °· Pale, dusky, cool, or sweaty skin. °· Vomiting blood. °· Bloody stool or bleeding from the rectum. °· Trouble breathing. °Symptoms of this injury can develop suddenly or slowly.  °DIAGNOSIS °This injury is diagnosed based on your symptoms and a physical exam. You may also have tests, including: °· Blood tests. °· Urine tests. °· Imaging tests, such as: °¨ A CT scan and ultrasound of your abdomen. °¨ X-rays of your chest and abdomen. °· A test in which a tube is used to flush your abdomen with fluid and check for blood (diagnostic peritoneal lavage). °TREATMENT °Treatment for this injury depends on its type and severity. Treatment options include: °· Observation. If the injury is mild, this may be the only treatment needed. °· Support of your  blood pressure and breathing. °· Getting blood, fluids, or medicine through an IV tube. °· Antibiotic medicine. °· Insertion of tubes into the stomach or bladder. °· A blood transfusion. °· A procedure to stop bleeding. This involves putting a long, thin tube (catheter) into one of your blood vessels (angiographic embolization). °· Surgery to open up your abdomen and control bleeding or repair damage (laparotomy). This may be done if tests suggest that you have peritonitis or bleeding that cannot be controlled with angiographic embolization. °HOME CARE INSTRUCTIONS °· Take medicines only as directed by your health care provider. °· If you were prescribed an antibiotic medicine, finish all of it even if you start to feel better. °· Follow your health care provider's instructions about diet and activity restrictions. °· Keep all follow-up visits as directed by your health care provider. This is important. °SEEK MEDICAL CARE IF: °· You continue to have abdominal pain. °· Your symptoms return. °· You develop new symptoms. °· You have blood in your urine or your bowel movements. °SEEK IMMEDIATE MEDICAL CARE IF: °· You vomit blood. °· You have heavy bleeding from your rectum. °· You have very bad abdominal pain. °· You have trouble breathing. °· You have chest pain. °· You have a fever. °· You have dizziness. °· You pass out. °  °This information is not intended to replace advice given to you by your health care provider. Make sure you discuss any questions you have with your health care provider. °  °Document Released:   01/12/2005 Document Revised: 04/21/2015 Document Reviewed: 11/26/2014 °Elsevier Interactive Patient Education ©2016 [employer] Inc[employer] ° °

## 2016-08-28 NOTE — Progress Notes (Signed)
1800 Pt is anxious to go home, tolerating soft diet, denies abdominal pain. Dr Andrey CampanileWilson notified, discharge order received. Discharged instructions given to pt, verbalized understanding.

## 2016-09-01 NOTE — Discharge Summary (Signed)
Physician Discharge Summary  Patient ID: Blake Harding MRN: [id number] DOB/AGE: 635-Oct-1987 30 y.o.  Admit date: 08/27/2016 Discharge date: 09/01/2016  Admission Diagnoses:status post fall from horse  Discharge Diagnoses:  Active Problems:   Blunt abdominal trauma   Blunt trauma   Discharged Condition: good  Hospital Course: patient was admitted from once a long ER to the floor secondary to a fall from horse approximately a week prior to admission.  Patient was having blood per rectum.  Patient on CT scan which revealed no acute trauma.  Patient had H&H drawn which trended he seems stable.  Blood per rectum decreased during his hospital stay.  His abdominal pain resolved while in the hospital.  He is able to tolerate regular diet.  He is otherwise detail for discharge and discharged home.  Consults: None  Significant Diagnostic Studies: none  Treatments: IV hydration and serial abdominal exams  Discharge Exam: Blood pressure 111/65, pulse (!) 55, temperature 98 F (36.7 C), temperature source Oral, resp. rate 17, height 6\' 2"  (1.88 m), weight 83.5 kg (184 lb), SpO2 100 %. General appearance: alert and cooperative GI: soft, non-tender; bowel sounds normal; no masses,  no organomegaly  Disposition: 01-Home or Self Care  Discharge Instructions    Call MD for:    Complete by:  As directed    Temperature >101   Call MD for:  hives    Complete by:  As directed    Call MD for:  persistant dizziness or light-headedness    Complete by:  As directed    Call MD for:  persistant nausea and vomiting    Complete by:  As directed    Call MD for:  redness, tenderness, or signs of infection (pain, swelling, redness, odor or green/yellow discharge around incision site)    Complete by:  As directed    Call MD for:  severe uncontrolled pain    Complete by:  As directed    Diet general    Complete by:  As directed    Continue light diet next few days   Discharge instructions    Complete  by:  As directed    See [facility] discharge instructions   Increase activity slowly    Complete by:  As directed        Medication List    You have not been prescribed any medications.    Follow-up Information    [facility]. Schedule an appointment as soon as possible for a visit in 2 week(s).   Contact information: [address] [place] [id number] [phone]          Signed: Marigene EhlersRamirez Jr., Jed Limerickrmando 09/01/2016, 8:36 AM

## 2017-08-11 IMAGING — CT CT ABD-PELV W/ CM
2 of 4 series · 16 of 46 positions shown, 18 images · IV contrast (ISOVUE)
Comparison: August 23, 2016

CLINICAL DATA: Status post trauma. A horse fell on the patient last
[REDACTED] with left-sided abdominal pain. Bloody stool. A CT scan
from August 23, 2016 demonstrated fluid in the pelvis with no
underlying cause identified.

EXAM:
CT ABDOMEN AND PELVIS WITH CONTRAST
TECHNIQUE: Multidetector CT imaging of the abdomen and pelvis was performed
using the standard protocol following bolus administration of
intravenous contrast.
CONTRAST:  100mL 0NYYBK-LOO IOPAMIDOL (0NYYBK-LOO) INJECTION 61%

[Series 2: abd/pel with · axial · 0.73mm/px · z∈[-483,-73]mm · 13 of 92 slices shown, 15 images]
[im 5/92  soft-tissue]
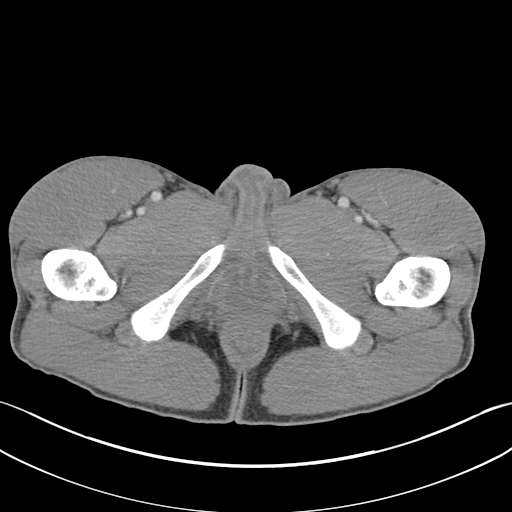
[im 5/92  bone]
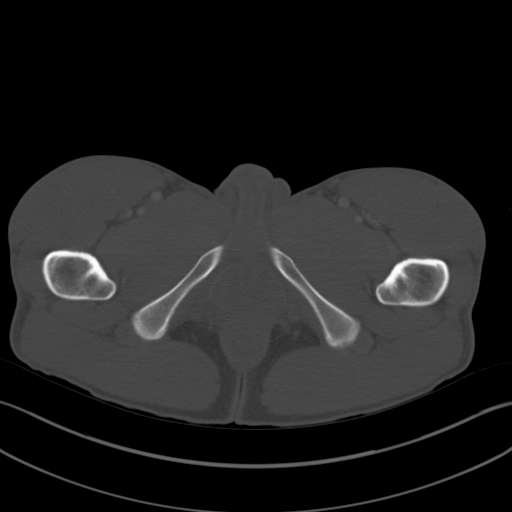
[im 14/92  soft-tissue]
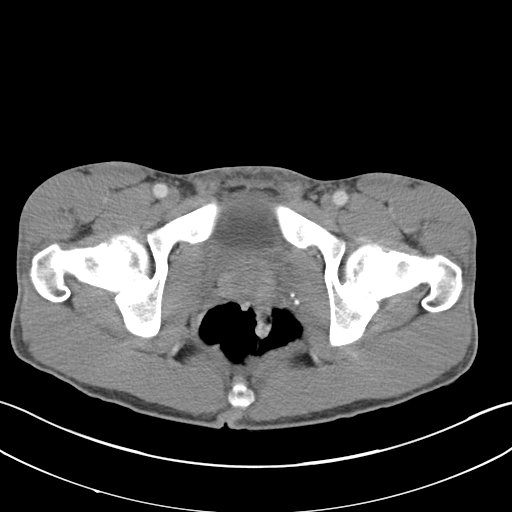
[im 19/92  soft-tissue]
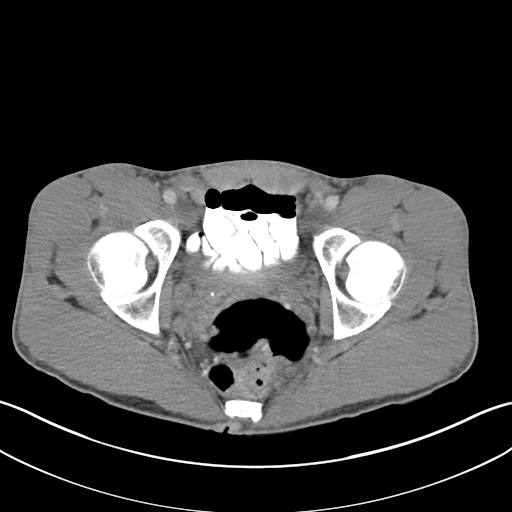
[im 28/92  soft-tissue]
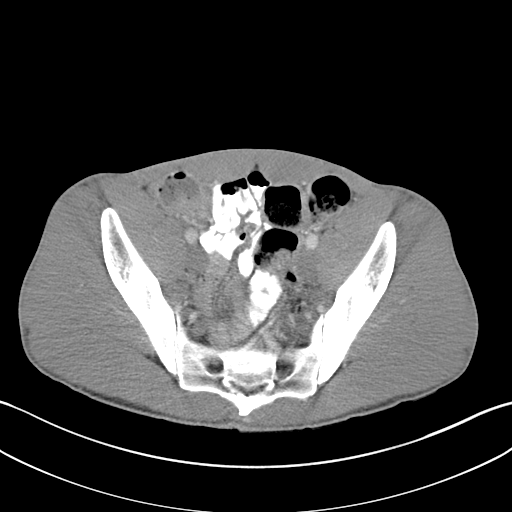
[im 32/92  soft-tissue]
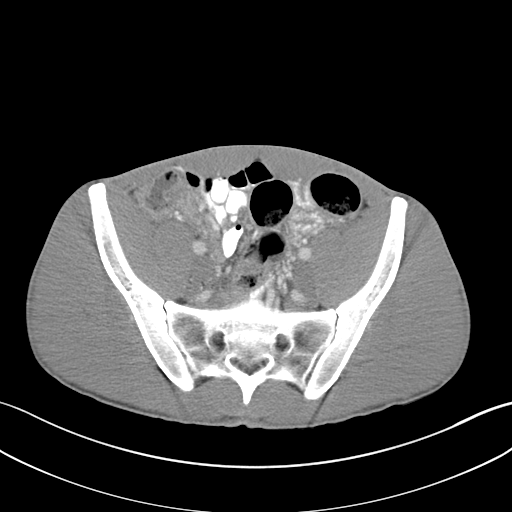
[im 41/92  soft-tissue]
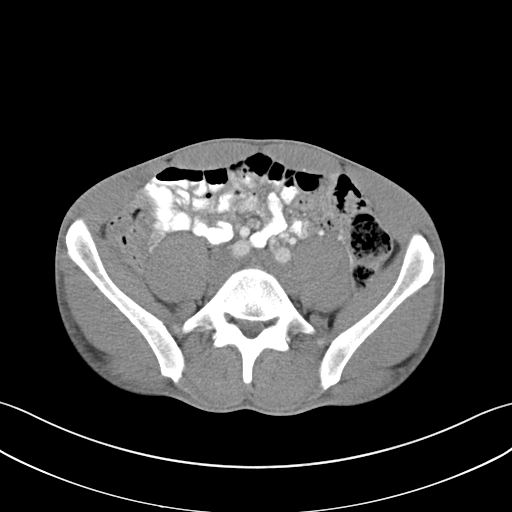
[im 46/92  soft-tissue]
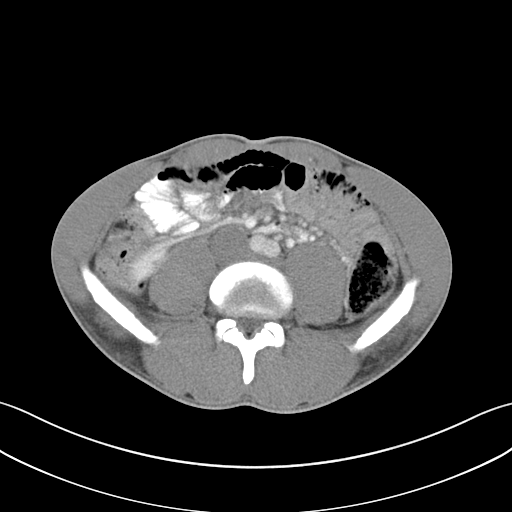
[im 51/92  soft-tissue]
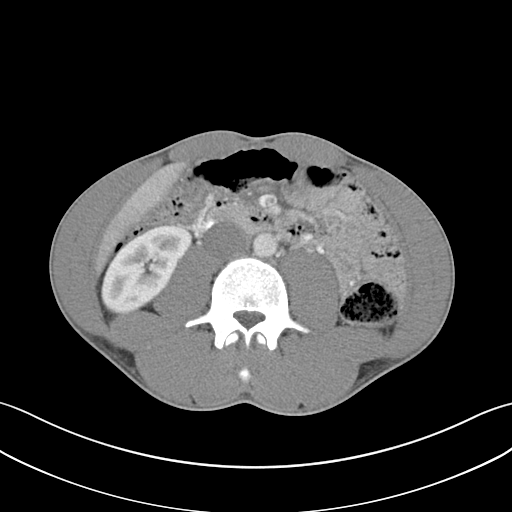
[im 60/92  soft-tissue]
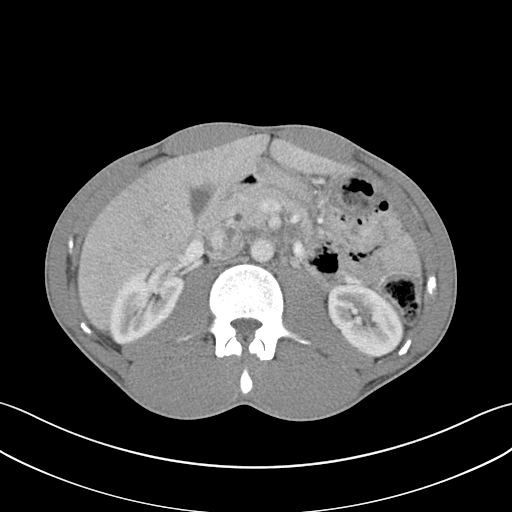
[im 60/92  bone]
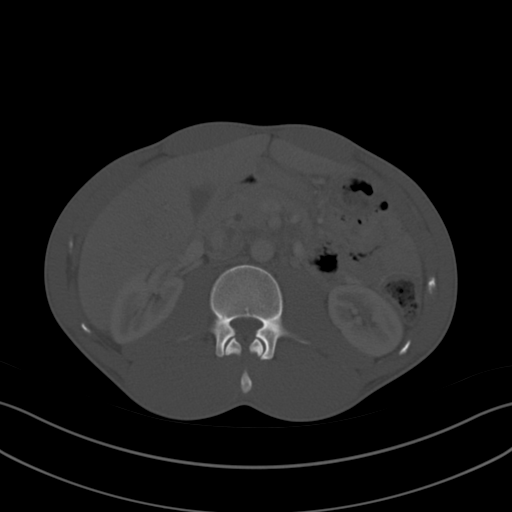
[im 64/92  soft-tissue]
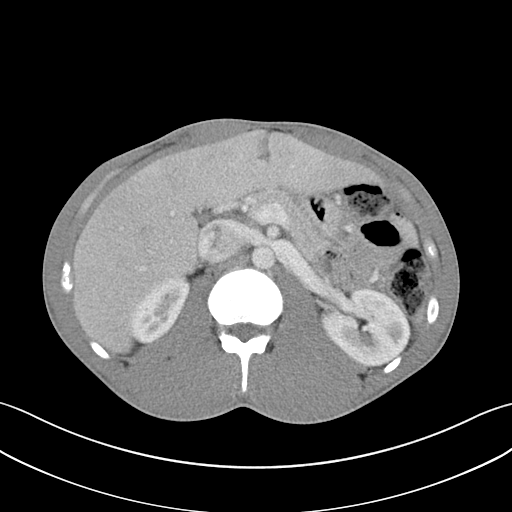
[im 73/92  soft-tissue]
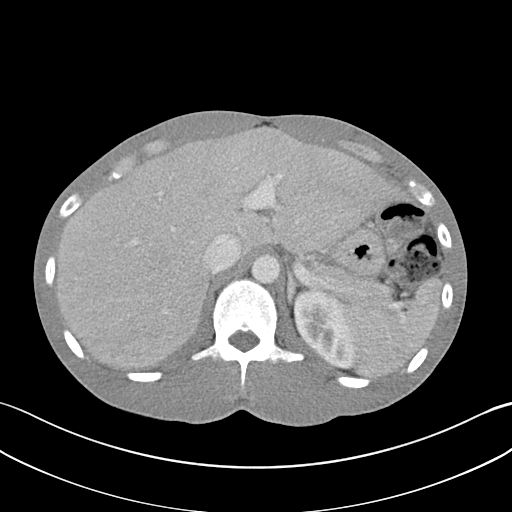
[im 78/92  soft-tissue]
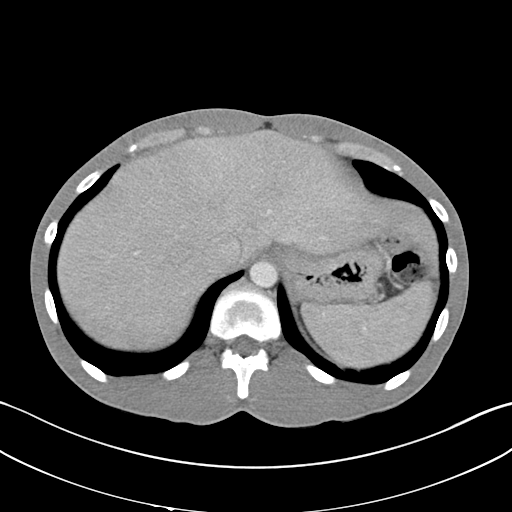
[im 87/92  soft-tissue]
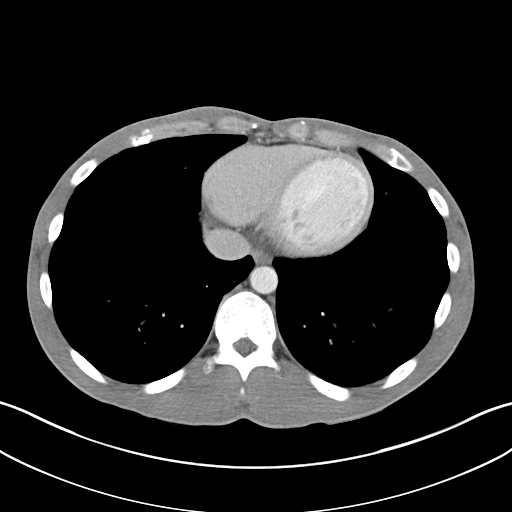

[Series 5: coronal a/|p · coronal · 0.74mm/px · 3 of 132 slices shown]
[im 44/132  soft-tissue]
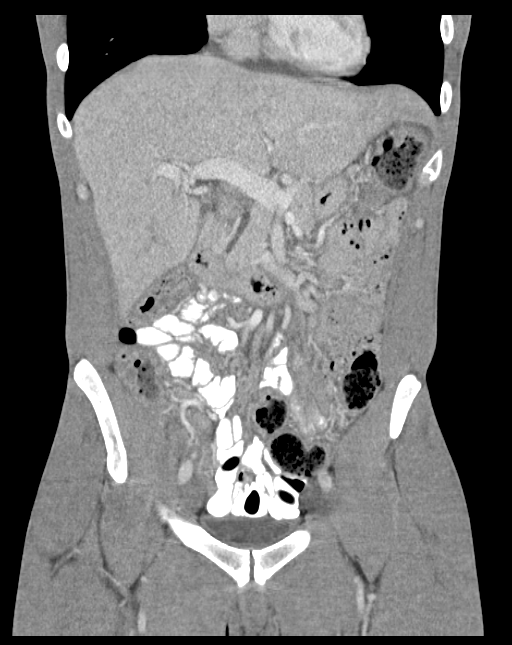
[im 59/132  soft-tissue]
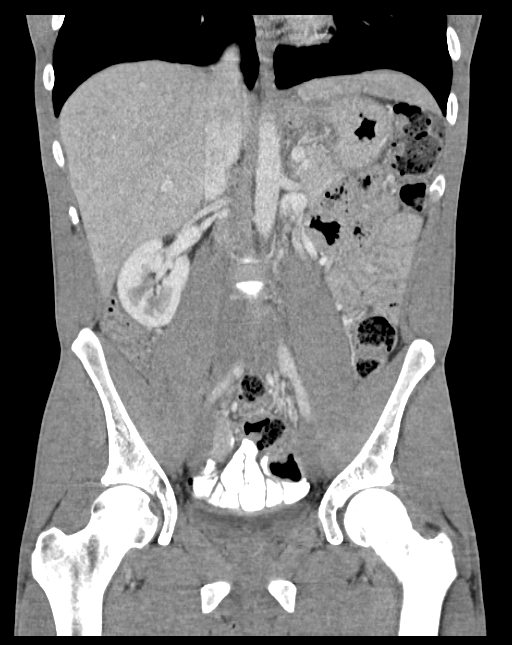
[im 73/132  soft-tissue]
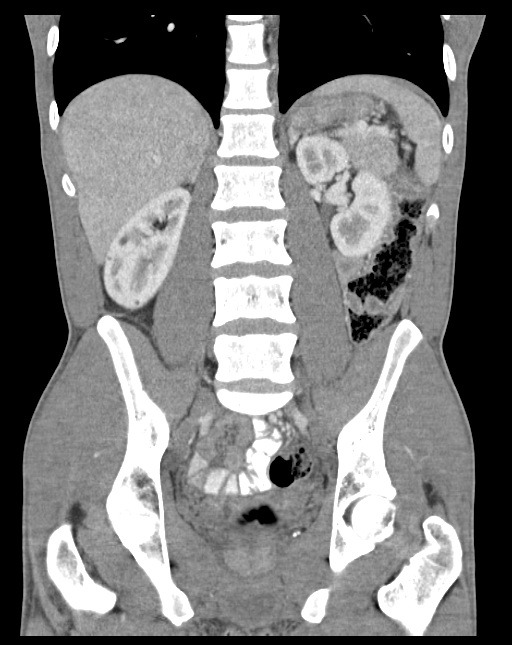

[16 of 46 positions shown; findings below may reference images not displayed]

FINDINGS: Lower chest: Normal.

Hepatobiliary: A tiny low-attenuation lesion in the left hepatic
lobe on image 16 is too small characterize but probably a cyst. A 6
mm lesion in the right hepatic lobe on image 36 is unchanged and
almost certainly a cyst as well but too small to characterize. The
liver, portal vein, and gallbladder are normal.

Pancreas: Unremarkable. No pancreatic ductal dilatation or
surrounding inflammatory changes.

Spleen: Normal in size without focal abnormality.

Adrenals/Urinary Tract: Probable tiny renal cysts are identified but
too small to characterize.

Stomach/Bowel: The stomach and small bowel are unremarkable. The
appendix is not well visualized but there is no evidence of
appendicitis. The colon is not well assessed due to lack of
distention with oral contrast but no colonic abnormalities are seen.

Vascular/Lymphatic: No significant vascular findings are present. No
enlarged abdominal or pelvic lymph nodes.

Reproductive: Prostate is unremarkable.

Other: No free air is identified on today's study. The free fluid in
the pelvis on the previous study has significantly improved with
only minimal fluid remaining.

Musculoskeletal: No fractures.
IMPRESSION: 1. The amount of free fluid in the pelvis has significantly
decreased in the interval with only a small amount of fluid
remaining.
2. No cause for bloody stool is seen. Evaluation of colon is limited
with CT imaging in general but especially due to the lack of
distention with oral contrast.
3. No other acute abnormalities.

## 2020-12-11 ENCOUNTER — Emergency Department (HOSPITAL_COMMUNITY): Payer: 59

## 2020-12-11 ENCOUNTER — Encounter (HOSPITAL_COMMUNITY): Payer: Self-pay | Admitting: Emergency Medicine

## 2020-12-11 ENCOUNTER — Emergency Department (HOSPITAL_COMMUNITY)
Admission: EM | Admit: 2020-12-11 | Discharge: 2020-12-11 | Disposition: A | Discharge status: Home / Self Care | Payer: 59 | Attending: Emergency Medicine | Admitting: Emergency Medicine

## 2020-12-11 DIAGNOSIS — F17200 Nicotine dependence, unspecified, uncomplicated: Secondary | ICD-10-CM | POA: Insufficient documentation

## 2020-12-11 DIAGNOSIS — M25512 Pain in left shoulder: Secondary | ICD-10-CM | POA: Diagnosis present

## 2020-12-11 DIAGNOSIS — M67912 Unspecified disorder of synovium and tendon, left shoulder: Secondary | ICD-10-CM

## 2020-12-11 DIAGNOSIS — M75102 Unspecified rotator cuff tear or rupture of left shoulder, not specified as traumatic: Secondary | ICD-10-CM | POA: Diagnosis not present

## 2020-12-11 MED ORDER — PREDNISONE 10 MG (21) PO TBPK: ORAL_TABLET | Freq: Every day | ORAL | 0 refills | Status: DC

## 2020-12-11 MED ORDER — METHOCARBAMOL 500 MG PO TABS
500.0000 mg | ORAL_TABLET | Freq: Once | ORAL | Status: AC
  Administered 2020-12-11: 500 mg via ORAL
  Filled 2020-12-11: qty 1

## 2020-12-11 MED ORDER — KETOROLAC TROMETHAMINE 60 MG/2ML IM SOLN
30.0000 mg | Freq: Once | INTRAMUSCULAR | Status: AC
  Administered 2020-12-11: 30 mg via INTRAMUSCULAR
  Filled 2020-12-11: qty 2

## 2020-12-11 MED ORDER — METHOCARBAMOL 500 MG PO TABS: 500.0000 mg | ORAL_TABLET | Freq: Two times a day (BID) | ORAL | 0 refills | Status: DC

## 2020-12-11 NOTE — ED Triage Notes (Signed)
Patient here from home reporting left should pain no injury. States that he woke up 1 week ago with pain that has gotten worse.

## 2020-12-11 NOTE — Discharge Instructions (Signed)
Thank you for allowing me to care for you today in the Emergency Department.   Your exam was concerning for a rotator cuff issue.  You can follow-up with [place] at urgent care.  Take 650 mg of Tylenol or 600 mg of ibuprofen with food every 6 hours for pain.  You can alternate between these 2 medications every 3 hours if your pain returns.  For instance, you can take Tylenol at noon, followed by a dose of ibuprofen at 3, followed by second dose of Tylenol and 6.  Take prednisone as prescribed.  This is an anti-inflammatory.  You can also take 1 tablet of Robaxin up to 2 times daily, which is a muscle relaxer.  Do not work or drive until you know how Robaxin impacts you as it may make you drowsy.  [facility]         [phone]  [facility] [address] [place], [place] [address] Mon-Fri  5:30PM - 9PM Sat 9 AM - 2 PM Sun 10 AM - 2 PM  Return to the emergency department if you develop severe redness, swelling, warmth to the joint, if you start having fevers or chills, your fingertips turn blue, if you have any fall or injury, or develop other new, concerning symptoms.

## 2020-12-11 NOTE — ED Provider Notes (Signed)
[facility]-EMERGENCY DEPT Provider Note   CSN: [id number] Arrival date & time: 12/11/20  0114     History Chief Complaint  Patient presents with  . Shoulder Pain    Blake Harding is a 34 y.o. male with no significant past medical history who presents the emergency department with a chief complaint of left shoulder pain.  The patient reports that he was intermittently having left shoulder pain intermittently with movement over the last few weeks, but over the last week pain has become more constant.  He characterizes the pain as 10 out of 10 and achy.  Pain is worse with lifting his arm above his head.  He reports that the pain radiates toward his neck and to the upper part of his back.  He denies any known trauma or injury.  He is having paresthesias radiating down to the second and third fingers on the left hand.  No numbness or weakness.  No elbow or wrist pain. He denies fever, chills, redness, warmth, or swelling. No history of prior shoulder surgery.  The history is provided by the patient and a significant other. No language interpreter was used.       History reviewed. No pertinent past medical history.  Patient Active Problem List   Diagnosis Date Noted  . Blunt trauma 08/28/2016  . Blunt abdominal trauma 08/27/2016    History reviewed. No pertinent surgical history.     No family history on file.  Social History   Tobacco Use  . Smoking status: Current Every Day Smoker  . Smokeless tobacco: Never Used  Substance Use Topics  . Alcohol use: Yes    Comment: social  . Drug use: No    Home Medications Prior to Admission medications   Medication Sig Start Date End Date Taking? Authorizing Provider  methocarbamol (ROBAXIN) 500 MG tablet Take 1 tablet (500 mg total) by mouth 2 (two) times daily. 12/11/20   Zeph Riebel A, PA-C  predniSONE (STERAPRED UNI-PAK 21 TAB) 10 MG (21) TBPK tablet Take by mouth daily. Take 6 tabs by mouth daily   for 2 days, then 5 tabs for 2 days, then 4 tabs for 2 days, then 3 tabs for 2 days, 2 tabs for 2 days, then 1 tab by mouth daily for 2 days 12/11/20   Muhammed Teutsch A, PA-C    Allergies    Patient has no known allergies.  Review of Systems   Review of Systems  Constitutional: Negative for appetite change, chills, diaphoresis and fever.  HENT: Negative for congestion and sore throat.   Eyes: Negative for visual disturbance.  Respiratory: Negative for shortness of breath.   Cardiovascular: Negative for chest pain.  Gastrointestinal: Negative for abdominal pain, diarrhea, nausea and vomiting.  Genitourinary: Negative for dysuria.  Musculoskeletal: Positive for arthralgias and myalgias. Negative for back pain.  Skin: Negative for rash and wound.  Allergic/Immunologic: Negative for immunocompromised state.  Neurological: Negative for dizziness, seizures, syncope, weakness, numbness and headaches.       Paresthesias  Psychiatric/Behavioral: Negative for confusion.    Physical Exam Updated Vital Signs BP (!) 129/98 (BP Location: Right Arm)   Pulse (!) 56   Temp 98.3 F (36.8 C) (Oral)   Resp 16   Ht 6\' 2"  (1.88 m)   Wt 83.9 kg   SpO2 100%   BMI 23.75 kg/m   Physical Exam Vitals and nursing note reviewed.  Constitutional:      Appearance: He is well-developed.  HENT:  Head: Normocephalic.  Eyes:     Conjunctiva/sclera: Conjunctivae normal.  Cardiovascular:     Rate and Rhythm: Normal rate and regular rhythm.     Heart sounds: No murmur heard.   Pulmonary:     Effort: Pulmonary effort is normal. No respiratory distress.     Breath sounds: No stridor. No wheezing, rhonchi or rales.  Chest:     Chest wall: No tenderness.  Abdominal:     Palpations: Abdomen is soft.  Musculoskeletal:     Cervical back: Neck supple.     Comments: Tender to palpation diffusely over the left shoulder and to the superior left scapula.  No crepitus or step-offs.  Clavicle is nontender.   Spine is nontender.  Positive empty can test on the left.  Full active and passive range of motion, but pain is increased with abduction. Negative Spurling test.  Radial pulses are 2+ and symmetric.  Sensation is intact and equal throughout the bilateral upper and lower extremities.  Full active and passive range of motion of the left wrist and elbow.  Good capillary refill.  I have a 5 strength against resistance of the bilateral upper extremities.  Skin:    General: Skin is warm and dry.  Neurological:     Mental Status: He is alert.  Psychiatric:        Behavior: Behavior normal.     ED Results / Procedures / Treatments   Labs (all labs ordered are listed, but only abnormal results are displayed) Labs Reviewed - No data to display  EKG None  Radiology DG Shoulder Left  Result Date: 12/11/2020 CLINICAL DATA:  Left shoulder pain. EXAM: LEFT SHOULDER - 2+ VIEW COMPARISON:  None. FINDINGS: There is no evidence of fracture or dislocation. There is no evidence of arthropathy or other focal bone abnormality. Soft tissues are unremarkable. IMPRESSION: Negative. Electronically Signed   By: Aram Candela M.D.   On: 12/11/2020 02:34    Procedures Procedures (including critical care time)  Medications Ordered in ED Medications  ketorolac (TORADOL) injection 30 mg (30 mg Intramuscular Given 12/11/20 0643)  methocarbamol (ROBAXIN) tablet 500 mg (500 mg Oral Given 12/11/20 2355)    ED Course  I have reviewed the triage vital signs and the nursing notes.  Pertinent labs & imaging results that were available during my care of the patient were reviewed by me and considered in my medical decision making (see chart for details).    MDM Rules/Calculators/A&P                          34 year old male with no significant past medical history who presents with left shoulder pain that was intermittent for the last few weeks, but became constant over the last week.  He is endorsing  paresthesias in his left second and third finger.  Vital signs are stable.  Spine is nontender and he has a negative Spurling's test.  Doubt cervical radiculopathy.  He has a positive empty can test and pain is increased with abduction and on the left.  Question rotator cuff tear.  Doubt septic joints, gallops, cellulitis, transverse myelitis, or occult fracture.  Imaging has been reviewed and independently interpreted by me.  X-ray of the left shoulder is unremarkable.  Patient was given Toradol and Robaxin and reports some improvement in his symptoms.  Will discharge with anti-inflammatories, prednisone Dosepak, Robaxin, and follow-up with orthopedics.  All questions answered.  He is hemodynamically stable no acute  distress.  Safe for discharge home with outpatient follow-up as indicated.  Final Clinical Impression(s) / ED Diagnoses Final diagnoses:  Acute pain of left shoulder  Disorder of left rotator cuff    Rx / DC Orders ED Discharge Orders         Ordered    predniSONE (STERAPRED UNI-PAK 21 TAB) 10 MG (21) TBPK tablet  Daily        12/11/20 0726    methocarbamol (ROBAXIN) 500 MG tablet  2 times daily        12/11/20 0726           Frederik Pear A, PA-C 12/11/20 0811    Paula Libra, MD 12/11/20 2224

## 2024-02-08 ENCOUNTER — Ambulatory Visit: Payer: 59 | Admitting: Family Medicine

## 2024-02-15 ENCOUNTER — Ambulatory Visit: Payer: 59 | Admitting: Family Medicine

## 2024-02-15 ENCOUNTER — Encounter: Payer: Self-pay | Admitting: Family Medicine

## 2024-02-15 VITALS — BP 108/68 | HR 56 | Temp 97.8°F | Ht 74.0 in | Wt 197.0 lb

## 2024-02-15 DIAGNOSIS — Z862 Personal history of diseases of the blood and blood-forming organs and certain disorders involving the immune mechanism: Secondary | ICD-10-CM

## 2024-02-15 DIAGNOSIS — Z833 Family history of diabetes mellitus: Secondary | ICD-10-CM | POA: Diagnosis not present

## 2024-02-15 DIAGNOSIS — R252 Cramp and spasm: Secondary | ICD-10-CM | POA: Diagnosis not present

## 2024-02-15 DIAGNOSIS — M545 Low back pain, unspecified: Secondary | ICD-10-CM | POA: Diagnosis not present

## 2024-02-15 DIAGNOSIS — G8929 Other chronic pain: Secondary | ICD-10-CM

## 2024-02-15 DIAGNOSIS — R5383 Other fatigue: Secondary | ICD-10-CM | POA: Diagnosis not present

## 2024-02-15 NOTE — Progress Notes (Signed)
 New Patient Office Visit  Subjective    Patient ID: Blake Harding, male    DOB: Jun 11, 1986  Age: 38 y.o. MRN: [id number]  CC:  Chief Complaint  Patient presents with   Establish Care    Cramping in feet, back pains, saw spots on liver and kidneys. Blood glucose maybe check?     HPI Blake Harding presents to establish care  He also goes to the [facility] in [place]   No medications   C/o fatigue and cramping in his feet. ?liver or kidney abnormalities.    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   [DISCONTINUED] predniSONE (STERAPRED UNI-PAK 21 TAB) 10 MG (21) TBPK tablet Take by mouth daily. Take 6 tabs by mouth daily  for 2 days, then 5 tabs for 2 days, then 4 tabs for 2 days, then 3 tabs for 2 days, 2 tabs for 2 days, then 1 tab by mouth daily for 2 days   No facility-administered encounter medications on file as of 02/15/2024.    Past Medical History:  Diagnosis Date   Depression    GERD (gastroesophageal reflux disease)     History reviewed. No pertinent surgical history.  Family History  Problem Relation Age of Onset   Cancer Mother    Diabetes Father    Hypertension Father     Social History   Socioeconomic History   Marital status: Single    Spouse name: Not on file   Number of children: Not on file   Years of education: Not on file   Highest education level: 12th grade  Occupational History   Not on file  Tobacco Use   Smoking status: Every Day    Current packs/day: 0.50    Types: Cigarettes   Smokeless tobacco: Never  Substance and Sexual Activity   Alcohol use: Yes    Alcohol/week: 2.0 standard drinks of alcohol    Types: 2 Cans of beer per week    Comment: social   Drug use: No   Sexual activity: Yes    Birth control/protection: None  Other Topics Concern   Not  on file  Social History Narrative   Not on file   Social Drivers of Health   Financial Resource Strain: Low Risk  (02/05/2024)   Overall Financial Resource Strain (CARDIA)    Difficulty of Paying Living Expenses: Not hard at all  Food Insecurity: No Food Insecurity (02/05/2024)   Hunger Vital Sign    Worried About Running Out of Food in the Last Year: Never true    Ran Out of Food in the Last Year: Never true  Transportation Needs: No Transportation Needs (02/05/2024)   PRAPARE - Administrator, Civil Service (Medical): No    Lack of Transportation (Non-Medical): No  Physical Activity: Sufficiently Active (02/05/2024)   Exercise Vital Sign    Days of Exercise per Week: 5 days    Minutes of Exercise per Session: 50 min  Stress: Patient Declined (02/05/2024)   [employer] of Occupational Health - Occupational Stress Questionnaire    Feeling of Stress : Patient declined  Social Connections: Moderately Integrated (02/05/2024)   Social Connection and Isolation Panel [NHANES]    Frequency of Communication with Friends and Family: More than three times a week    Frequency of Social  Gatherings with Friends and Family: Once a week    Attends Religious Services: More than 4 times per year    Active Member of [employer] or Organizations: No    Attends Engineer, structural: Not on file    Marital Status: Living with partner  Intimate Partner Violence: Not on file    Review of Systems  Constitutional:  Positive for malaise/fatigue. Negative for chills, fever and weight loss.  Respiratory:  Negative for shortness of breath.   Cardiovascular:  Negative for chest pain, palpitations and leg swelling.  Gastrointestinal:  Negative for abdominal pain, constipation, diarrhea, nausea and vomiting.  Genitourinary:  Negative for dysuria, frequency and urgency.  Musculoskeletal:  Positive for back pain. Negative for falls.       Cramping in feet  Neurological:  Negative for  dizziness, tingling, focal weakness and headaches.  Psychiatric/Behavioral:  Negative for depression. The patient is not nervous/anxious.         Objective    BP 108/68 (BP Location: Left Arm, Patient Position: Sitting)   Pulse (!) 56   Temp 97.8 F (36.6 C) (Temporal)   Ht 6\' 2"  (1.88 m)   Wt 197 lb (89.4 kg)   SpO2 98%   BMI 25.29 kg/m   Physical Exam Constitutional:      General: He is not in acute distress.    Appearance: He is not ill-appearing.  Eyes:     Extraocular Movements: Extraocular movements intact.     Conjunctiva/sclera: Conjunctivae normal.  Cardiovascular:     Rate and Rhythm: Normal rate.  Pulmonary:     Effort: Pulmonary effort is normal.  Musculoskeletal:     Cervical back: Normal range of motion and neck supple.     Right lower leg: No edema.     Left lower leg: No edema.  Skin:    General: Skin is warm and dry.  Neurological:     General: No focal deficit present.     Mental Status: He is alert and oriented to person, place, and time.     Motor: No weakness.     Coordination: Coordination normal.     Gait: Gait normal.  Psychiatric:        Mood and Affect: Mood normal.        Behavior: Behavior normal.        Thought Content: Thought content normal.         Assessment & Plan:   Problem List Items Addressed This Visit   None Visit Diagnoses       Fatigue, unspecified type    -  Primary   Relevant Orders   CBC with Differential/Platelet (Completed)   Comprehensive metabolic panel (Completed)   TSH (Completed)   T4, free (Completed)   Hemoglobin A1c (Completed)   VITAMIN D 25 Hydroxy (Vit-D Deficiency, Fractures) (Completed)   Vitamin B12 (Completed)   Ferritin (Completed)   Folate (Completed)     Cramping of feet       Relevant Orders   CBC with Differential/Platelet (Completed)   Comprehensive metabolic panel (Completed)   TSH (Completed)   T4, free (Completed)   Magnesium (Completed)   Ferritin (Completed)     Chronic  bilateral low back pain without sciatica         History of anemia       Relevant Orders   CBC with Differential/Platelet (Completed)   Vitamin B12 (Completed)   Ferritin (Completed)   Folate (Completed)     Family history  of diabetes mellitus in first degree relative       Relevant Orders   Hemoglobin A1c (Completed)      Here to establish care.  I will request records from the [facility] to look for underlying etiology for fatigue.  He will follow up pending lab results or when due for a fasting CPE  Return for pending labs.   Hetty Blend, NP-C

## 2024-02-16 LAB — MAGNESIUM: Magnesium: 2 mg/dL (ref 1.5–2.5)

## 2024-02-16 LAB — VITAMIN B12: Vitamin B-12: 770 pg/mL (ref 211–911)

## 2024-02-16 LAB — CBC WITH DIFFERENTIAL/PLATELET
Basophils Absolute: 0.1 10*3/uL (ref 0.0–0.1)
Basophils Relative: 1.3 % (ref 0.0–3.0)
Eosinophils Absolute: 0.1 10*3/uL (ref 0.0–0.7)
Eosinophils Relative: 1.7 % (ref 0.0–5.0)
HCT: 40.3 % (ref 39.0–52.0)
Hemoglobin: 13.8 g/dL (ref 13.0–17.0)
Lymphocytes Relative: 38.5 % (ref 12.0–46.0)
Lymphs Abs: 2.4 10*3/uL (ref 0.7–4.0)
MCHC: 34.4 g/dL (ref 30.0–36.0)
MCV: 96.5 fl (ref 78.0–100.0)
Monocytes Absolute: 0.6 10*3/uL (ref 0.1–1.0)
Monocytes Relative: 10.1 % (ref 3.0–12.0)
Neutro Abs: 3 10*3/uL (ref 1.4–7.7)
Neutrophils Relative %: 48.4 % (ref 43.0–77.0)
Platelets: 205 10*3/uL (ref 150.0–400.0)
RBC: 4.18 Mil/uL — ABNORMAL LOW (ref 4.22–5.81)
RDW: 13 % (ref 11.5–15.5)
WBC: 6.3 10*3/uL (ref 4.0–10.5)

## 2024-02-16 LAB — COMPREHENSIVE METABOLIC PANEL
ALT: 22 U/L (ref 0–53)
AST: 20 U/L (ref 0–37)
Albumin: 4.2 g/dL (ref 3.5–5.2)
Alkaline Phosphatase: 48 U/L (ref 39–117)
BUN: 12 mg/dL (ref 6–23)
CO2: 29 meq/L (ref 19–32)
Calcium: 8.7 mg/dL (ref 8.4–10.5)
Chloride: 103 meq/L (ref 96–112)
Creatinine, Ser: 1.12 mg/dL (ref 0.40–1.50)
GFR: 84.22 mL/min (ref >60.00)
Glucose, Bld: 72 mg/dL (ref 70–99)
Potassium: 4.1 meq/L (ref 3.5–5.1)
Sodium: 139 meq/L (ref 135–145)
Total Bilirubin: 0.4 mg/dL (ref 0.2–1.2)
Total Protein: 6.8 g/dL (ref 6.0–8.3)

## 2024-02-16 LAB — FERRITIN: Ferritin: 67 ng/mL (ref 22.0–322.0)

## 2024-02-16 LAB — HEMOGLOBIN A1C: Hgb A1c MFr Bld: 6.2 % (ref 4.6–6.5)

## 2024-02-16 LAB — VITAMIN D 25 HYDROXY (VIT D DEFICIENCY, FRACTURES): VITD: 18.78 ng/mL — ABNORMAL LOW (ref 30.00–100.00)

## 2024-02-16 LAB — TSH: TSH: 0.65 u[IU]/mL (ref 0.35–5.50)

## 2024-02-16 LAB — FOLATE: Folate: 11.9 ng/mL (ref >5.9)

## 2024-02-16 LAB — T4, FREE: Free T4: 0.78 ng/dL (ref 0.60–1.60)

## 2024-02-18 ENCOUNTER — Encounter: Payer: Self-pay | Admitting: Family Medicine

## 2024-08-13 ENCOUNTER — Telehealth: Payer: Self-pay | Admitting: Family Medicine

## 2024-08-13 ENCOUNTER — Other Ambulatory Visit: Payer: Self-pay | Admitting: Family Medicine

## 2024-08-13 MED ORDER — VITAMIN D (ERGOCALCIFEROL) 1.25 MG (50000 UNIT) PO CAPS: 50000.0000 [IU] | ORAL_CAPSULE | ORAL | 1 refills | Status: AC

## 2024-08-13 NOTE — Telephone Encounter (Signed)
 Copied from CRM [id number]. Topic: Clinical - Prescription Issue >> Aug 13, 2024  2:39 PM Rea C wrote: Reason for CRM: Vitamin D . Patient said that he met with NP Vickie back in February and she never called a script in for Vitamin D  as discussed in the appointment notes. For 02/18/24, NP Vickie noted she will send in a high dose vitamin D  prescription to take once weekly for the next 6 weeks and patient never received it.   [id number] [place], [place] - [address] [place] [address] Phone: [phone] Fax: [phone] Hours: Not open 24 hours  [phone] (M)

## 2024-10-03 ENCOUNTER — Ambulatory Visit

## 2024-10-03 ENCOUNTER — Encounter: Payer: Self-pay | Admitting: Family Medicine

## 2024-10-03 ENCOUNTER — Ambulatory Visit: Admitting: Family Medicine

## 2024-10-03 ENCOUNTER — Ambulatory Visit: Admitting: Podiatry

## 2024-10-03 ENCOUNTER — Ambulatory Visit: Payer: Self-pay | Admitting: Family Medicine

## 2024-10-03 VITALS — BP 102/70 | HR 59 | Temp 97.9°F | Ht 74.0 in | Wt 189.0 lb

## 2024-10-03 DIAGNOSIS — M25476 Effusion, unspecified foot: Secondary | ICD-10-CM

## 2024-10-03 DIAGNOSIS — E559 Vitamin D deficiency, unspecified: Secondary | ICD-10-CM | POA: Diagnosis not present

## 2024-10-03 DIAGNOSIS — S97112A Crushing injury of left great toe, initial encounter: Secondary | ICD-10-CM

## 2024-10-03 DIAGNOSIS — M205X2 Other deformities of toe(s) (acquired), left foot: Secondary | ICD-10-CM

## 2024-10-03 DIAGNOSIS — M65972 Unspecified synovitis and tenosynovitis, left ankle and foot: Secondary | ICD-10-CM | POA: Diagnosis not present

## 2024-10-03 DIAGNOSIS — M7752 Other enthesopathy of left foot: Secondary | ICD-10-CM

## 2024-10-03 DIAGNOSIS — R7303 Prediabetes: Secondary | ICD-10-CM

## 2024-10-03 LAB — COMPREHENSIVE METABOLIC PANEL WITH GFR
ALT: 18 U/L (ref 0–53)
AST: 17 U/L (ref 0–37)
Albumin: 4.6 g/dL (ref 3.5–5.2)
Alkaline Phosphatase: 52 U/L (ref 39–117)
BUN: 15 mg/dL (ref 6–23)
CO2: 29 meq/L (ref 19–32)
Calcium: 9 mg/dL (ref 8.4–10.5)
Chloride: 105 meq/L (ref 96–112)
Creatinine, Ser: 1.01 mg/dL (ref 0.40–1.50)
GFR: 94.92 mL/min (ref >60.00)
Glucose, Bld: 94 mg/dL (ref 70–99)
Potassium: 4.4 meq/L (ref 3.5–5.1)
Sodium: 140 meq/L (ref 135–145)
Total Bilirubin: 0.4 mg/dL (ref 0.2–1.2)
Total Protein: 7.1 g/dL (ref 6.0–8.3)

## 2024-10-03 LAB — TSH: TSH: 0.66 u[IU]/mL (ref 0.35–5.50)

## 2024-10-03 LAB — CBC WITH DIFFERENTIAL/PLATELET
Basophils Absolute: 0 K/uL (ref 0.0–0.1)
Basophils Relative: 0.5 % (ref 0.0–3.0)
Eosinophils Absolute: 0.1 K/uL (ref 0.0–0.7)
Eosinophils Relative: 0.7 % (ref 0.0–5.0)
HCT: 41.8 % (ref 39.0–52.0)
Hemoglobin: 14 g/dL (ref 13.0–17.0)
Lymphocytes Relative: 20.6 % (ref 12.0–46.0)
Lymphs Abs: 1.9 K/uL (ref 0.7–4.0)
MCHC: 33.6 g/dL (ref 30.0–36.0)
MCV: 94.6 fl (ref 78.0–100.0)
Monocytes Absolute: 0.5 K/uL (ref 0.1–1.0)
Monocytes Relative: 5.8 % (ref 3.0–12.0)
Neutro Abs: 6.7 K/uL (ref 1.4–7.7)
Neutrophils Relative %: 72.4 % (ref 43.0–77.0)
Platelets: 221 K/uL (ref 150.0–400.0)
RBC: 4.41 Mil/uL (ref 4.22–5.81)
RDW: 12.6 % (ref 11.5–15.5)
WBC: 9.3 K/uL (ref 4.0–10.5)

## 2024-10-03 LAB — HEMOGLOBIN A1C: Hgb A1c MFr Bld: 6.4 % (ref 4.6–6.5)

## 2024-10-03 LAB — VITAMIN D 25 HYDROXY (VIT D DEFICIENCY, FRACTURES): VITD: 46.48 ng/mL (ref 30.00–100.00)

## 2024-10-03 MED ORDER — TRIAMCINOLONE ACETONIDE 10 MG/ML IJ SUSP
5.0000 mg | Freq: Once | INTRAMUSCULAR | Status: AC
  Administered 2024-10-03: 5 mg

## 2024-10-03 MED ORDER — MELOXICAM 15 MG PO TABS: 15.0000 mg | ORAL_TABLET | Freq: Every day | ORAL | 0 refills | Status: AC

## 2024-10-03 NOTE — Patient Instructions (Signed)
Hallux Limitus Hallux limitus is a condition involving pain and a loss of motion of the first (big) toe. The pain gets worse with lifting up (extension) of the toe. This is usually due to arthritic bony bumps (spurring) of the joint at the base of the big toe.  SYMPTOMS   Pain, with lifting up of the toe.  Tenderness over the joint where the big toe meets the foot.  Redness, swelling, and warmth over the top of the base of the big toe (sometimes).  Foot pain, stiffness, and limping. CAUSES  Hallux limitus is caused by arthritis of the joint where the big toe meets the foot. The arthritis creates a bone spur that pinches the soft tissues when the toe is extended. RISK INCREASES WITH:  Tight shoes with a narrow toe box.  Family history of foot problems.  Gout and rheumatoid and psoriatic arthritis.  History of previous toe injury, including "turf toe."  Long first toe, flat feet, and other big toe bony bumps.  Arthritis of the big toe. PREVENTION   Wear wide-toed shoes that fit well.  Tape the big toe to reduce motion and to prevent pinching of the tissues between the bone.  Maintain physical fitness:  Foot and ankle flexibility.  Muscle strength and endurance. PROGNOSIS  This condition can usually be managed with proper treatment. However, surgery is typically required to prevent the problem from recurring.  RELATED COMPLICATIONS  Injury to other areas of the foot or ankle, caused by abnormal walking in an attempt to avoid the pain felt when walking normally. TREATMENT Treatment first involves stopping the activities that aggravate your symptoms. Ice and medicine can be used to reduce the pain and inflammation. Modifications to shoes may help reduce pain, including wearing stiff-soled shoes, shoes with a wide toe box, inserting a padded donut to relieve pressure on top of the joint, or wearing an arch support. Corticosteroid injections may be given to reduce inflammation. If  nonsurgical treatment is unsuccessful, surgery may be needed. Surgical options include removing the arthritic bony spur, cutting a bone in the foot to change the arc of motion (allowing the toe to extend more), or fusion of the joint (eliminating all motion in the joint at the base of the big toe).  MEDICATION   If pain medicine is needed, nonsteroidal anti-inflammatory medicines (aspirin and ibuprofen), or other minor pain relievers (acetaminophen), are often advised.  Do not take pain medicine for 7 days before surgery.  Prescription pain relievers are usually prescribed only after surgery. Use only as directed and only as much as you need.  Ointments for arthritis, applied to the skin, may give some relief.  Injections of corticosteroids may be given to reduce inflammation. HEAT AND COLD  Cold treatment (icing) relieves pain and reduces inflammation. Cold treatment should be applied for 10 to 15 minutes every 2 to 3 hours, and immediately after activity that aggravates your symptoms. Use ice packs or an ice massage.  Heat treatment may be used before performing the stretching and strengthening activities prescribed by your caregiver, physical therapist, or athletic trainer. Use a heat pack or a warm water soak. SEEK MEDICAL CARE IF:   Symptoms get worse or do not improve in 2 weeks, despite treatment.  After surgery you develop fever, increasing pain, redness, swelling, drainage of fluids, bleeding, or increasing warmth.  New, unexplained symptoms develop.    

## 2024-10-03 NOTE — Progress Notes (Signed)
 Chief Complaint  Patient presents with   Foot Pain    Dropped impact wrench on L great toe over 6 months ago.  Has become increasingly painful. Pain 9.  Swollen. Not diabetic. no anti coag    HPI: 38 y.o. male presents today with ongoing left first MPJ pain and stiffness.  States this has been ongoing for 6 to 8 months.  It limits his activity and mobility during the day.  Rates pain close to a 9/10.  Reports swelling over this area.  Denies any acute trauma.  He does think that around onset he dropped a wrench on it and long before this horse had stepped on the foot.  Has not tried any significant home treatments at this point.  Wears boots for work.  Past Medical History:  Diagnosis Date   Depression    GERD (gastroesophageal reflux disease)     History reviewed. No pertinent surgical history.  No Known Allergies  ROS denies any nausea, vomiting, fever, chills, chest pain, shortness of breath.   Physical Exam: There were no vitals filed for this visit.  General: The patient is alert and oriented x3 in no acute distress.  Dermatology: Skin is warm, dry and supple bilateral lower extremities. Interspaces are clear of maceration and debris.    Vascular: Palpable pedal pulses bilaterally. Capillary refill within normal limits.  No appreciable edema.  No erythema or calor.  Neurological: Light touch sensation grossly intact bilateral feet.   Musculoskeletal Exam: Muscle strength 5/5 all major muscles.  Less than 15 degrees left first MPJ dorsiflexion noted.  Dorsal jamming present.  Localized inflammation about this area and tenderness on palpation.  Pain with first MPJ range of motion as well.  Palpable dorsal spurring present.  Radiographic Exam left foot 3 views:  Normal osseous mineralization. No fractures or acute osseous irregularities noted.  There is some first MPJ joint space narrowing present.  Squaring of the first metatarsal head present.  Significant spurring  dorsal joint.  Joint mouse present.  Assessment/Plan of Care: 1. Acquired hallux limitus of left foot   2. Synovitis of left foot      Meds ordered this encounter  Medications   triamcinolone acetonide (KENALOG) 10 MG/ML injection 5 mg   meloxicam (MOBIC) 15 MG tablet    Sig: Take 1 tablet (15 mg total) by mouth daily.    Dispense:  30 tablet    Refill:  0   None  Discussed clinical findings with patient today.  # Hallux limitus Discussed findings of hallux limitus with left patient.  Discussed that this is chronic and ongoing process.  Today recommended use of good stiff soled shoe and use of NSAIDs.  Course of meloxicam sent to patient's pharmacy.  Also discussed potential benefit of custom orthotic.  He will consider this going forward and we will discuss further at follow-up.  We did also discuss that there is degree of arthritis about the first MPJ and that this will likely progress.  Did briefly discuss surgery in the event that this cannot be managed with conservative therapy.  # Synovitis left first MPJ Regarding localized synovitis of the left first MPJ, recommended intra-articular corticosteroid injection.  Patient was agreeable.  Betadine skin prep.  Injected 0.5 cc of 0.5% Marcaine plain, 5 mg Kenalog, 0.5 cc of dexamethasone in a local fashion after identifying the joint.  Band-Aid applied.  Patient tolerates well.  Leave Band-Aid in place for 24 hours. -I certify  that this diagnosis represents a distinct and separate diagnosis that requires evaluation and treatment separate from other procedures or diagnosis   Follow-up in about 2 weeks   Martice Doty L. Lamount MAUL, AACFAS [facility]     [address] [place], [place] [address]                Office [phone]  Fax [phone]

## 2024-10-03 NOTE — Patient Instructions (Addendum)
 Please go downstairs for an x-ray of your foot before you leave and labs  I will be in touch with your results  I am referring you to [facility] for further evaluation due to the injury.  They will call you to schedule  I recommend returning in 2 months for a fasting complete physical exam.

## 2024-10-03 NOTE — Progress Notes (Signed)
 Subjective:     Patient ID: Blake Harding, male    DOB: 1986/03/07, 38 y.o.   MRN: [id number]  Chief Complaint  Patient presents with   Foot Pain    Left foot big toe, throbs and hurts constantly, swollen. Has been going on for a year and a half, kept breaking it    Foot Pain Pertinent negatives include no abdominal pain, chest pain, chills, fever, headaches, nausea or vomiting.    Discussed the use of AI scribe software for clinical note transcription with the patient, who gave verbal consent to proceed.  History of Present Illness [name] is a 38 year old male who presents with left big toe pain persisting for eight months.  Left hallux pain - Sharp pain localized to the joint at the base of the left big toe, persisting for eight months - Onset after initial injury in March of the previous year when a horse stepped on the toe - Secondary injury occurred shortly after when an impact wrench was dropped on the same toe - Pain described as feeling like the joint is 'about to pop' but does not - Pain occurs both during movement and at rest - No improvement in symptoms since onset - Occasional tingling in the toes, not associated with pain - No use of pain medications  Prediabetes and vitamin d  deficiency - A1c measured at 6.2 in February, consistent with prediabetes - Low vitamin D  levels identified - Takes prescription-strength vitamin D  supplement weekly  Physical activity - 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 No pertinent surgical history.  Family History  Problem Relation  Age of Onset   Cancer Mother    Diabetes Father    Hypertension Father     Social History   Socioeconomic History   Marital status: Single    Spouse name: Not on file   Number of children: Not on file   Years of education: Not on file   Highest education level: 12th grade  Occupational History   Not on file  Tobacco Use   Smoking status: Every Day    Current packs/day: 0.50    Types: Cigarettes   Smokeless tobacco: Never  Substance and Sexual Activity   Alcohol use: Yes    Alcohol/week: 2.0 standard drinks of alcohol    Types: 2 Cans of beer per week    Comment: social   Drug use: No   Sexual activity: Yes    Birth control/protection: None  Other Topics Concern   Not on file  Social History Narrative   Not on file   Social Drivers of Health   Financial Resource Strain: Low Risk  (02/05/2024)   Overall Financial Resource Strain (CARDIA)    Difficulty of Paying Living Expenses: Not hard at all  Food Insecurity: No Food Insecurity (02/05/2024)   Hunger Vital Sign    Worried About Running Out of Food in the Last Year: Never true    Ran Out of Food in the Last Year: Never true  Transportation Needs: No Transportation Needs (02/05/2024)   PRAPARE - Administrator, Civil Service (Medical): No    Lack of Transportation (Non-Medical): No  Physical Activity: Sufficiently Active (02/05/2024)   Exercise Vital Sign    Days of Exercise per Week: 5 days    Minutes of Exercise per Session: 50 min  Stress: Patient Declined (02/05/2024)   [employer] of Occupational Health - Occupational Stress Questionnaire    Feeling of Stress : Patient declined  Social Connections: Moderately Integrated (02/05/2024)   Social Connection and Isolation Panel    Frequency of Communication with Friends and Family: More than three times a week    Frequency of Social Gatherings with Friends and Family: Once a week    Attends Religious Services: More than 4 times per year    Active  Member of [employer] or Organizations: No    Attends Engineer, structural: Not on file    Marital Status: Living with partner  Intimate Partner Violence: Not on file    Outpatient Medications Prior to Visit  Medication Sig Dispense Refill   Vitamin D , Ergocalciferol , (DRISDOL ) 1.25 MG (50000 UNIT) CAPS capsule Take 1 capsule (50,000 Units total) by mouth every 7 (seven) days. 6 capsule 1   No facility-administered medications prior to visit.    No Known Allergies  Review of Systems  Constitutional:  Negative for chills and fever.  Respiratory:  Negative for shortness of breath.   Cardiovascular:  Negative for chest pain, palpitations and leg swelling.  Gastrointestinal:  Negative for abdominal pain, constipation, diarrhea, nausea and vomiting.  Genitourinary:  Negative for dysuria, frequency and urgency.  Musculoskeletal:  Positive for joint pain.  Neurological:  Negative for dizziness, focal weakness and headaches.       Objective:    Physical Exam Constitutional:      General: He is not in acute distress.    Appearance: He is not ill-appearing.  Eyes:     Extraocular Movements: Extraocular movements intact.     Conjunctiva/sclera: Conjunctivae normal.  Cardiovascular:     Rate and Rhythm: Normal rate.  Pulmonary:     Effort: Pulmonary effort is normal.  Musculoskeletal:     Cervical back: Normal range of motion and neck supple.     Right lower leg: No edema.     Left lower leg: No edema.     Right ankle: Normal.     Left ankle: Normal.     Left foot: Decreased range of motion. Normal capillary refill. Swelling and bony tenderness present. Normal pulse.     Comments: Swelling of left first MTP joint, TTP. No sign of infection   Skin:    General: Skin is warm and dry.  Neurological:     General: No focal deficit present.     Mental Status: He is alert and oriented to person, place, and time.  Psychiatric:        Mood and Affect: Mood normal.        Behavior:  Behavior normal.        Thought Content: Thought content normal.      BP 102/70   Pulse (!) 59   Temp 97.9 F (36.6 C) (Temporal)   Ht 6' 2 (1.88 m)   Wt 189 lb (85.7 kg)   SpO2 97%   BMI 24.27 kg/m  Wt Readings from Last 3 Encounters:  10/03/24 189 lb (85.7 kg)  02/15/24 197 lb (89.4 kg)  12/11/20 185 lb (83.9 kg)  Assessment & Plan:   Problem List Items Addressed This Visit   None Visit Diagnoses       Crushing injury of left great toe, initial encounter    -  Primary   Relevant Orders   DG Foot Complete Right   Ambulatory referral to Podiatry   DG Foot Complete Left     Prediabetes       Relevant Orders   CBC with Differential/Platelet   Comprehensive metabolic panel with GFR   Hemoglobin A1c   TSH     Vitamin D  deficiency       Relevant Orders   VITAMIN D  25 Hydroxy (Vit-D Deficiency, Fractures)     Swelling of first metatarsophalangeal (MTP) joint       Relevant Orders   DG Foot Complete Right   Ambulatory referral to Podiatry   DG Foot Complete Left       Assessment and Plan Assessment & Plan Prediabetes A1c was 6.2% in February, consistent with prediabetes.  No family history of diabetes. High intake of bread noted. - Order labs to reassess A1c and other relevant parameters.  Vitamin D  deficiency, on treatment Currently on prescription strength vitamin D , one pill weekly. - Order vitamin D  level to assess current status. - Evaluate need for continuation of high dose vitamin D  supplementation.  Chronic pain of the ;eft great toe and MTP joint after crush injury eight months ago Chronic pain in the left great toe and MTP joint for eight months post-crush injury. Pain is sharp and sometimes tingling. No prior imaging or specialist consultation. Possible arthritis suspected. - Order x-ray of the left foot to assess for arthritis or other abnormalities. - Refer to podiatrist at [facility] for further evaluation and management.     I  am having Blake RONAL Harding maintain his Vitamin D  (Ergocalciferol ).  No orders of the defined types were placed in this encounter.

## 2024-10-25 ENCOUNTER — Encounter: Payer: Self-pay | Admitting: Podiatry

## 2024-10-25 ENCOUNTER — Ambulatory Visit: Admitting: Podiatry

## 2024-10-25 DIAGNOSIS — M205X2 Other deformities of toe(s) (acquired), left foot: Secondary | ICD-10-CM | POA: Diagnosis not present

## 2024-10-25 NOTE — Patient Instructions (Signed)
Hallux Limitus Hallux limitus is a condition involving pain and a loss of motion of the first (big) toe. The pain gets worse with lifting up (extension) of the toe. This is usually due to arthritic bony bumps (spurring) of the joint at the base of the big toe.  SYMPTOMS   Pain, with lifting up of the toe.  Tenderness over the joint where the big toe meets the foot.  Redness, swelling, and warmth over the top of the base of the big toe (sometimes).  Foot pain, stiffness, and limping. CAUSES  Hallux limitus is caused by arthritis of the joint where the big toe meets the foot. The arthritis creates a bone spur that pinches the soft tissues when the toe is extended. RISK INCREASES WITH:  Tight shoes with a narrow toe box.  Family history of foot problems.  Gout and rheumatoid and psoriatic arthritis.  History of previous toe injury, including "turf toe."  Long first toe, flat feet, and other big toe bony bumps.  Arthritis of the big toe. PREVENTION   Wear wide-toed shoes that fit well.  Tape the big toe to reduce motion and to prevent pinching of the tissues between the bone.  Maintain physical fitness:  Foot and ankle flexibility.  Muscle strength and endurance. PROGNOSIS  This condition can usually be managed with proper treatment. However, surgery is typically required to prevent the problem from recurring.  RELATED COMPLICATIONS  Injury to other areas of the foot or ankle, caused by abnormal walking in an attempt to avoid the pain felt when walking normally. TREATMENT Treatment first involves stopping the activities that aggravate your symptoms. Ice and medicine can be used to reduce the pain and inflammation. Modifications to shoes may help reduce pain, including wearing stiff-soled shoes, shoes with a wide toe box, inserting a padded donut to relieve pressure on top of the joint, or wearing an arch support. Corticosteroid injections may be given to reduce inflammation. If  nonsurgical treatment is unsuccessful, surgery may be needed. Surgical options include removing the arthritic bony spur, cutting a bone in the foot to change the arc of motion (allowing the toe to extend more), or fusion of the joint (eliminating all motion in the joint at the base of the big toe).  MEDICATION   If pain medicine is needed, nonsteroidal anti-inflammatory medicines (aspirin and ibuprofen), or other minor pain relievers (acetaminophen), are often advised.  Do not take pain medicine for 7 days before surgery.  Prescription pain relievers are usually prescribed only after surgery. Use only as directed and only as much as you need.  Ointments for arthritis, applied to the skin, may give some relief.  Injections of corticosteroids may be given to reduce inflammation. HEAT AND COLD  Cold treatment (icing) relieves pain and reduces inflammation. Cold treatment should be applied for 10 to 15 minutes every 2 to 3 hours, and immediately after activity that aggravates your symptoms. Use ice packs or an ice massage.  Heat treatment may be used before performing the stretching and strengthening activities prescribed by your caregiver, physical therapist, or athletic trainer. Use a heat pack or a warm water soak. SEEK MEDICAL CARE IF:   Symptoms get worse or do not improve in 2 weeks, despite treatment.  After surgery you develop fever, increasing pain, redness, swelling, drainage of fluids, bleeding, or increasing warmth.  New, unexplained symptoms develop.    

## 2024-10-25 NOTE — Progress Notes (Signed)
  Subjective:  Patient ID: Blake Harding, male    DOB: 30-Apr-1986,  MRN: [id number]  Chief Complaint  Patient presents with   Nail Problem    L Great toe feeling 50% better. Improved mobility.  Not diabetic no anti coag     Discussed the use of AI scribe software for clinical note transcription with the patient, who gave verbal consent to proceed.  History of Present Illness Blake Harding is a 38 year old male with arthritis in the left big toe joint who presents with persistent pain and limited mobility.  His left big toe joint shows slight improvement since the last visit. Sharp pain has subsided, reports decrease instances of random sharp pains.. Overall, he experiences less pain and increased activity. The previous injection was beneficial despite initial discomfort.  He has not contacted his insurance about coverage for a custom insert due to cost concerns. He typically wears boots with built-in soles and is concerned about fitting a custom insert. He wears size 14 shoes and uses Crocs when driving.  He is a tobacco user attempting to reduce usage for health and financial reasons.      Objective:    Physical Exam CARDIOVASCULAR: DP pulses palpable bilaterally. MUSCULOSKELETAL: Palpable spurring at the level of the first MPJ dorsally and medially. ROM limited to 15-25 degrees dorsiflexion, 30 degrees plantar flexion at left first MPJ. NEUROLOGICAL: Protective and light touch sensation intact. Muscle strength 5/5 in dorsiflexion, plantar flexion, inversion, eversion. SKIN: Pedal skin well hydrated.   No images are attached to the encounter.    Results Left Foot X-ray: Spurring and joint changes around the first metatarsophalangeal joint, loose bone fragment present. (October 2025)   Assessment:   1. Acquired hallux limitus of left foot      Plan:  Patient was evaluated and treated and all questions answered.  Assessment and Plan Assessment & Plan Left  hallux limitus with dorsal and medial spurring Chronic left hallux limitus with significant spurring causing mechanical limitation. Pain improved with injection but persists intermittently. Conservative management preferred to delay progression and preserve mobility. Surgery not indicated as joint space remains. Explained surgical risks including complications and potential joint fusion. - Recommended custom insert with Morton's extension. - Advised use of shoes with stiff toe box. - Discussed insurance coverage for inserts and offered codes for inquiry. - Advised ibuprofen or Aleve and icing for flare-ups. - Discussed potential surgical options if conservative measures fail.  Tobacco use Current tobacco use with desire to reduce. Smoking cessation important for future foot surgery due to impaired healing risks. - Advised smoking cessation, especially if considering future foot surgery.      Return if symptoms worsen or fail to improve, for hallux limitus.
